# Patient Record
Sex: Female | Born: 1970 | Race: White | Hispanic: No | Marital: Single | Smoking: Never smoker
Health system: Southern US, Community
[De-identification: ages and names within clinical notes are randomized; demographics above are authoritative.]

## PROBLEM LIST (undated history)

## (undated) DIAGNOSIS — T7840XA Allergy, unspecified, initial encounter: Secondary | ICD-10-CM

## (undated) DIAGNOSIS — M6281 Muscle weakness (generalized): Secondary | ICD-10-CM

## (undated) DIAGNOSIS — R519 Headache, unspecified: Secondary | ICD-10-CM

## (undated) DIAGNOSIS — M542 Cervicalgia: Secondary | ICD-10-CM

## (undated) DIAGNOSIS — J45909 Unspecified asthma, uncomplicated: Secondary | ICD-10-CM

## (undated) DIAGNOSIS — F419 Anxiety disorder, unspecified: Secondary | ICD-10-CM

## (undated) DIAGNOSIS — Q2112 Patent foramen ovale: Secondary | ICD-10-CM

## (undated) DIAGNOSIS — M5030 Other cervical disc degeneration, unspecified cervical region: Secondary | ICD-10-CM

## (undated) DIAGNOSIS — E559 Vitamin D deficiency, unspecified: Secondary | ICD-10-CM

## (undated) DIAGNOSIS — E049 Nontoxic goiter, unspecified: Secondary | ICD-10-CM

## (undated) DIAGNOSIS — R002 Palpitations: Secondary | ICD-10-CM

## (undated) DIAGNOSIS — F329 Major depressive disorder, single episode, unspecified: Secondary | ICD-10-CM

## (undated) DIAGNOSIS — I341 Nonrheumatic mitral (valve) prolapse: Secondary | ICD-10-CM

## (undated) DIAGNOSIS — N946 Dysmenorrhea, unspecified: Secondary | ICD-10-CM

## (undated) DIAGNOSIS — Q211 Atrial septal defect: Secondary | ICD-10-CM

## (undated) DIAGNOSIS — J309 Allergic rhinitis, unspecified: Secondary | ICD-10-CM

## (undated) DIAGNOSIS — E040 Nontoxic diffuse goiter: Secondary | ICD-10-CM

## (undated) DIAGNOSIS — F32A Depression, unspecified: Secondary | ICD-10-CM

## (undated) DIAGNOSIS — R319 Hematuria, unspecified: Secondary | ICD-10-CM

## (undated) DIAGNOSIS — G8929 Other chronic pain: Secondary | ICD-10-CM

## (undated) DIAGNOSIS — M5020 Other cervical disc displacement, unspecified cervical region: Secondary | ICD-10-CM

## (undated) DIAGNOSIS — J342 Deviated nasal septum: Secondary | ICD-10-CM

## (undated) DIAGNOSIS — R51 Headache: Secondary | ICD-10-CM

## (undated) DIAGNOSIS — R8789 Other abnormal findings in specimens from female genital organs: Secondary | ICD-10-CM

## (undated) HISTORY — DX: Allergic rhinitis, unspecified: J30.9

## (undated) HISTORY — DX: Unspecified asthma, uncomplicated: J45.909

## (undated) HISTORY — DX: Deviated nasal septum: J34.2

## (undated) HISTORY — DX: Allergy, unspecified, initial encounter: T78.40XA

## (undated) HISTORY — DX: Anxiety disorder, unspecified: F41.9

## (undated) HISTORY — DX: Nonrheumatic mitral (valve) prolapse: I34.1

## (undated) HISTORY — DX: Nontoxic diffuse goiter: E04.0

## (undated) HISTORY — DX: Nontoxic goiter, unspecified: E04.9

## (undated) HISTORY — DX: Hematuria, unspecified: R31.9

## (undated) HISTORY — DX: Other chronic pain: G89.29

## (undated) HISTORY — DX: Headache, unspecified: R51.9

## (undated) HISTORY — DX: Other cervical disc displacement, unspecified cervical region: M50.20

## (undated) HISTORY — DX: Depression, unspecified: F32.A

## (undated) HISTORY — DX: Other cervical disc degeneration, unspecified cervical region: M50.30

## (undated) HISTORY — DX: Patent foramen ovale: Q21.12

## (undated) HISTORY — DX: Dysmenorrhea, unspecified: N94.6

## (undated) HISTORY — DX: Muscle weakness (generalized): M62.81

## (undated) HISTORY — DX: Palpitations: R00.2

## (undated) HISTORY — DX: Vitamin D deficiency, unspecified: E55.9

## (undated) HISTORY — DX: Cervicalgia: M54.2

## (undated) HISTORY — DX: Atrial septal defect: Q21.1

## (undated) HISTORY — DX: Major depressive disorder, single episode, unspecified: F32.9

## (undated) HISTORY — DX: Other abnormal findings in specimens from female genital organs: R87.89

## (undated) HISTORY — DX: Headache: R51

---
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
## 2015-09-22 DIAGNOSIS — R87618 Other abnormal cytological findings on specimens from cervix uteri: Secondary | ICD-10-CM

## 2015-09-22 HISTORY — DX: Other abnormal cytological findings on specimens from cervix uteri: R87.618

## 2016-01-28 LAB — HEPATIC FUNCTION PANEL
ALT: 23 U/L (ref 7–35)
AST: 24 U/L (ref 13–35)
Alkaline Phosphatase: 52 U/L (ref 25–125)
Bilirubin, Total: 0.6 mg/dL

## 2016-01-28 LAB — LIPID PANEL
CHOLESTEROL: 190 mg/dL (ref 0–200)
HDL: 59 mg/dL (ref 35–70)
LDL Cholesterol: 119 mg/dL
Triglycerides: 58 mg/dL (ref 40–160)

## 2016-01-28 LAB — BASIC METABOLIC PANEL
BUN: 12 mg/dL (ref 4–21)
Creatinine: 0.8 mg/dL (ref <=1.1)
Glucose: 98 mg/dL
POTASSIUM: 4.7 mmol/L (ref 3.4–5.3)
SODIUM: 142 mmol/L (ref 137–147)

## 2016-01-28 LAB — TSH: TSH: 1.8 u[IU]/mL (ref <=5.90)

## 2016-01-28 LAB — CBC AND DIFFERENTIAL
HCT: 41 % (ref 36–46)
HEMOGLOBIN: 13.8 g/dL (ref 12.0–16.0)
Platelets: 258 10*3/uL (ref 150–399)
WBC: 4 10*3/mL

## 2016-12-16 ENCOUNTER — Encounter: Payer: Self-pay | Admitting: Gastroenterology

## 2016-12-24 ENCOUNTER — Ambulatory Visit: Payer: Self-pay | Admitting: Family Medicine

## 2016-12-29 ENCOUNTER — Ambulatory Visit: Payer: Self-pay | Admitting: Gastroenterology

## 2017-01-01 ENCOUNTER — Ambulatory Visit: Payer: Self-pay | Admitting: Family Medicine

## 2017-01-04 ENCOUNTER — Other Ambulatory Visit: Payer: Self-pay | Admitting: Orthopedic Surgery

## 2017-01-04 DIAGNOSIS — M25331 Other instability, right wrist: Secondary | ICD-10-CM

## 2017-01-13 ENCOUNTER — Telehealth: Payer: Self-pay | Admitting: Family Medicine

## 2017-01-13 NOTE — Telephone Encounter (Signed)
Patient LMOM stating that she had some questions about our facility and she stated that she had an appointment Friday, 01/15/17.  I tried to return patient call but no answer and VM full.  Patient's appointment is on 01/18/17.

## 2017-01-14 ENCOUNTER — Encounter: Payer: Self-pay | Admitting: *Deleted

## 2017-01-15 ENCOUNTER — Other Ambulatory Visit: Payer: Self-pay

## 2017-01-18 ENCOUNTER — Encounter: Payer: Self-pay | Admitting: Family Medicine

## 2017-01-18 ENCOUNTER — Ambulatory Visit (INDEPENDENT_AMBULATORY_CARE_PROVIDER_SITE_OTHER): Payer: BLUE CROSS/BLUE SHIELD | Admitting: Family Medicine

## 2017-01-18 VITALS — BP 118/84 | HR 85 | Temp 98.5°F | Resp 20 | Ht 64.0 in | Wt 119.8 lb

## 2017-01-18 DIAGNOSIS — Q2112 Patent foramen ovale: Secondary | ICD-10-CM

## 2017-01-18 DIAGNOSIS — J342 Deviated nasal septum: Secondary | ICD-10-CM | POA: Diagnosis not present

## 2017-01-18 DIAGNOSIS — Q211 Atrial septal defect: Secondary | ICD-10-CM

## 2017-01-18 DIAGNOSIS — Z7689 Persons encountering health services in other specified circumstances: Secondary | ICD-10-CM

## 2017-01-18 DIAGNOSIS — J309 Allergic rhinitis, unspecified: Secondary | ICD-10-CM

## 2017-01-18 DIAGNOSIS — T7840XA Allergy, unspecified, initial encounter: Secondary | ICD-10-CM | POA: Diagnosis not present

## 2017-01-18 DIAGNOSIS — M5020 Other cervical disc displacement, unspecified cervical region: Secondary | ICD-10-CM | POA: Diagnosis not present

## 2017-01-18 DIAGNOSIS — M5030 Other cervical disc degeneration, unspecified cervical region: Secondary | ICD-10-CM

## 2017-01-18 DIAGNOSIS — M542 Cervicalgia: Secondary | ICD-10-CM | POA: Diagnosis not present

## 2017-01-18 NOTE — Progress Notes (Signed)
Patient ID: Erica Chung, female  DOB: Feb 11, 1971, 46 y.o.   MRN: [id number] Patient Care Team    Relationship Specialty Notifications Start End  Natalia Leatherwood, DO PCP - General Family Medicine  01/18/17   Karene Fry. Judithe Modest, MD Referring Physician Cardiology  01/18/17    Comment: PFO  Denton Lank, DO  Sports Medicine  01/18/17     Chief Complaint  Patient presents with  . Establish Care    Subjective:  Erica Chung is a 46 y.o.  female present for new patient establishment. All past medical history, surgical history, allergies, family history, immunizations, medications and social history were obtained in the electronic medical record today. All recent labs, ED visits and hospitalizations within the last year were reviewed.  Patient presents today to transfer from [facility]. She has multiple complaints today including wanting her CPE. Discussed options with patient today, and she would like to cover her acute conditions. Patient has a significant medical history of having a PFO, and is followed with cardiology, Dr. Judithe Modest. She reports chronic sinus condition, with bilateral ringing of the ears. She states this has been evaluated by ENT and they cannot find any cause for her ringing of the ears. She endorses multiple food and seasonal allergies. She endorses nasal septum deviation. She does not take allergy medications. She states she has had the testings through an allergist in the past and would like to see an allergist again concerning possible treatment.  She also has a history of a bulging cervical disc. She has known of the cervical disc for quite a few years. At one time she was established with neurology. She reports having MRIs completed. She has performed physical therapy in the past, with decent resolution of symptoms. She endorses having worsening cervical spine and left arm tingling sensation down to her hand. She states it's her entire hand that is involved. She  denies any weakness to her upper extremities. She reports it "just feels different. ". She does not like to take medications. MRI of the cervical spine 11/21/2013 unremarkable, with the exception of C5-C6 interspace demonstrates broad-based posterior disc bulge without evidence of canal stenosis or nerve root impingement. Patient was given a trial baclofen, and physical therapy.  Health maintenance:  Colonoscopy: No family history, routine screening at 38. Mammogram: No family history, has gynecology. 12/2015, birads1 per records. Cervical cancer screening: last pap: Reported 06/21/2016. Immunizations: tdap unknown (2005 per record), Influenza unknown (encouraged yearly) Infectious disease screening: HIV completed DEXA: n/a   Depression screen [facility] 2/9 01/18/2017  Decreased Interest 0  Down, Depressed, Hopeless 0  PHQ - 2 Score 0   No flowsheet data found.    Current Exercise Habits: The patient does not participate in regular exercise at present Exercise limited by: None identified Fall Risk  01/18/2017  Falls in the past year? No     Immunization History  Administered Date(s) Administered  . Varicella 06/20/2014    No exam data present  Past Medical History:  Diagnosis Date  . Allergic rhinitis   . Allergy   . Anxiety   . 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 Cervicalgia   . Chronic headaches   . Depression   . Deviated septum    perforation  . Dysmenorrhea   . Frequent headaches   . Goiter diffuse   . Hematuria   . Mitral valve prolapse   . Muscle weakness (generalized)   . Palpitation  pvc  . Patent foramen ovale   . Reactive airway disease   . Vitamin D deficiency    Allergies  Allergen Reactions  . Tree Extract     Tree, grass, shrubs   Past Surgical History:  Procedure Laterality Date  . DENTAL SURGERY  1990   wisdom teeth   Family History  Problem Relation Age of Onset  . Depression Mother   . Alcohol abuse Father     . Hearing loss Father   . Early death Father    Social History   Social History  . Marital status: Single    Spouse name: N/A  . Number of children: N/A  . Years of education: 89   Occupational History  . Property management    Social History Main Topics  . Smoking status: Never Smoker  . Smokeless tobacco: Never Used  . Alcohol use Yes     Comment: occasional  . Drug use: No  . Sexual activity: Yes    Birth control/ protection: Condom   Other Topics Concern  . Not on file   Social History Narrative   Single. Employed in Therapist, sports.    Associates degree.    Occasionally uses herbal remedies and takes daily vitamins.   Wears her seatbelt. Smoke detector in the home.   Feels safe in her relationships.      Allergies as of 01/18/2017      Reactions   Tree Extract    Tree, grass, shrubs      Medication List       Accurate as of 01/18/17  6:38 PM. Always use your most recent med list.          multivitamin capsule Take by mouth.       All past medical history, surgical history, allergies, family history, immunizations andmedications were updated in the EMR today and reviewed under the history and medication portions of their EMR.    No results found for this or any previous visit (from the past 2160 hour(s)).  Patient was never admitted.   ROS: 14 pt review of systems performed and negative (unless mentioned in an HPI)  Objective: BP 118/84 (BP Location: Left Arm, Patient Position: Sitting, Cuff Size: Normal)   Pulse 85   Temp 98.5 F (36.9 C)   Resp 20   Ht  (1.626 m)   Wt 119 lb 12.8 oz (54.3 kg)   LMP 12/30/2016 (Exact Date)   SpO2 100%   BMI 20.56 kg/m  Gen: Afebrile. No acute distress. Nontoxic in appearance, well-developed, well-nourished,  Caucasian female. HENT: AT. [place]. Bilateral TM visualized and normal in appearance, normal external auditory canal. MMM, no oral lesions, adequate dentition. Bilateral nares without erythema  or swelling, mild deviation of septum.Throat without erythema, ulcerations or exudates. No Cough on exam, no hoarseness on exam. Eyes:Pupils Equal Round Reactive to light, Extraocular movements intact,  Conjunctiva without redness, discharge or icterus. Neck/lymp/endocrine: Supple, no lymphadenopathy CV: RRR no murmur appreciated, no edema Chest: CTAB, no wheeze, rhonchi or crackles.  Skin: No rashes, purpura or petechiae. Warm and well-perfused. Skin intact. Neuro/Msk: Normal gait. PERLA. EOMi. Alert. Oriented x3.  No cervical spine bone tenderness to palpation. No erythema, no soft tissue swelling. Full range of motion cervical spine, with discomfort and extension bilateral rotation, bilateral sidebending and flexion. Normal abduction bilateral arms. Negative Tinel's left elbow and wrist. Cranial nerves II through XII intact. Muscle strength 5/5 upper/lower extremity. DTRs equal bilaterally. Psych: Normal affect, dress and  demeanor. Normal speech. Normal thought content and judgment.   Assessment/plan: Shaily Librizzi is a 46 y.o. female present for est care, with multiple complaints. Patent foramen ovale - Continue routine follow-ups with cardiology, Dr. Judithe Modest.  Deviated septum/allergic rhinitis/reports of multiple allergies/chronic sinusitis - Patient states she has been evaluated by ENT and would now like referral to a allergist. She is currently not on any allergy medications, and would like to avoid these. She is considering allergy injections. 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 Patient is against medications and injections. She is currently not on any medications or NSAIDs and functioning okay. Exam today is normal outside of discomfort, which appears to be mild, with range of motion of neck. She is currently neurovascularly intact distally in  her left arm and hand. Given options patient would like to try OMT, therefore sports med referral has been placed. She would also like to restart physical therapy, referral placed. - Ambulatory referral to Sports Medicine - Ambulatory referral to Physical Therapy - If worsening would want patient to have MRI and be seen by her neurologist. Patient is in agreement with plan.  Advised patient to make an appointment for complete physical, that is at least one year in 1 day since her last physical.    Note is dictated utilizing voice recognition software. Although note has been proof read prior to signing, occasional typographical errors still can be missed. If any questions arise, please do not hesitate to call for verification.  Electronically signed by: Felix Pacini, DO [facility]

## 2017-01-18 NOTE — Patient Instructions (Addendum)
It was nice to meet you today.   Try ALIGN probiotic daily. It is one of the better ones. Water 100 ounces a day.   I have placed an order for OMT and PT.   I also referred you to allergist.    Please help [place] help you:  We are honored you have chosen [facility] for your Primary Care home. Below you will find basic instructions that you may need to access in the future. Please help [place] help you by reading the instructions, which cover many of the frequent questions we experience.   Prescription refills and request:  -In order to allow more efficient response time, please call your pharmacy for all refills. They will forward the request electronically to [place]. This allows for the quickest possible response. Request left on a nurse line can take longer to refill, since these are checked as time allows between office patients and other phone calls.  - refill request can take up to 3-5 working days to complete.  - If request is sent electronically and request is appropiate, it is usually completed in 1-2 business days.  - all patients will need to be seen routinely for all chronic medical conditions requiring prescription medications (see follow-up below). If you are overdue for follow up on your condition, you will be asked to make an appointment and we will call in enough medication to cover you until your appointment (up to 30 days).  - all controlled substances will require a face to face visit to request/refill.  - if you desire your prescriptions to go through a new pharmacy, and have an active script at original pharmacy, you will need to call your pharmacy and have scripts transferred to new pharmacy. This is completed between the pharmacy locations and not by your provider.    Results: If any images or labs were ordered, it can take up to 1 week to get results depending on the test ordered and the lab/facility running and resulting the test. - Normal or stable results, which do not  need further discussion, may be released to your mychart immediately with attached note to you. A call may not be generated for normal results. Please make certain to sign up for mychart. If you have questions on how to activate your mychart you can call the front office.  - If your results need further discussion, our office will attempt to contact you via phone, and if unable to reach you after 2 attempts, we will release your abnormal result to your mychart with instructions.  - All results will be automatically released in mychart after 1 week.  - Your provider will provide you with explanation and instruction on all relevant material in your results. Please keep in mind, results and labs may appear confusing or abnormal to the untrained eye, but it does not mean they are actually abnormal for you personally. If you have any questions about your results that are not covered, or you desire more detailed explanation than what was provided, you should make an appointment with your provider to do so.   Our office handles many outgoing and incoming calls daily. If we have not contacted you within 1 week about your results, please check your mychart to see if there is a message first and if not, then contact our office.  In helping with this matter, you help decrease call volume, and therefore allow [place] to be able to respond to patients needs more efficiently.  Acute office visits (sick visit):  An acute visit is intended for a new problem and are scheduled in shorter time slots to allow schedule openings for patients with new problems. This is the appropriate visit to discuss a new problem. In order to provide you with excellent quality medical care with proper time for you to explain your problem, have an exam and receive treatment with instructions, these appointments should be limited to one new problem per visit. If you experience a new problem, in which you desire to be addressed, please make an acute  office visit, we save openings on the schedule to accommodate you. Please do not save your new problem for any other type of visit, let us take care of it properly and quickly for you.   Follow up visits:  Depending on your condition(s) your provider will need to see you routinely in order to provide you with quality care and prescribe medication(s). Most chronic conditions (Example: hypertension, Diabetes, depression/anxiety... etc), require visits a couple times a year. Your provider will instruct you on proper follow up for your personal medical conditions and history. Please make certain to make follow up appointments for your condition as instructed. Failing to do so could result in lapse in your medication treatment/refills. If you request a refill, and are overdue to be seen on a condition, we will always provide you with a 30 day script (once) to allow you time to schedule.    Medicare wellness (well visit): - we have a wonderful Nurse Selena Batten(Kim), that will meet with you and provide you will yearly medicare wellness visits. These visits should occur yearly (can not be scheduled less than 1 calendar year apart) and cover preventive health, immunizations, advance directives and screenings you are entitled to yearly through your medicare benefits. Do not miss out on your entitled benefits, this is when medicare will pay for these benefits to be ordered for you.  These are strongly encouraged by your provider and is the appropriate type of visit to make certain you are up to date with all preventive health benefits. If you have not had your medicare wellness exam in the last 12 months, please make certain to schedule one by calling the office and schedule your medicare wellness with Selena BattenKim as soon as possible.   Yearly physical (well visit):  - Adults are recommended to be seen yearly for physicals. Check with your insurance and date of your last physical, most insurances require one calendar year between  physicals. Physicals include all preventive health topics, screenings, medical exam and labs that are appropriate for gender/age and history. You may have fasting labs needed at this visit. This is a well visit (not a sick visit), new problems should not be covered during this visit (see acute visit).  - Pediatric patients are seen more frequently when they are younger. Your provider will advise you on well child visit timing that is appropriate for your their age. - This is not a medicare wellness visit. Medicare wellness exams do not have an exam portion to the visit. Some medicare companies allow for a physical, some do not allow a yearly physical. If your medicare allows a yearly physical you can schedule the medicare wellness with our nurse Selena BattenKim and have your physical with your provider after, on the same day. Please check with insurance for your full benefits.   Late Policy/No Shows:  - all new patients should arrive 15-30 minutes earlier than appointment to allow us time  to  obtain all personal demographics,  insurance information and for you to complete office paperwork. - All established patients should arrive 10-15 minutes earlier than appointment time to update all information and be checked in .  - In our best efforts to run on time, if you are late for your appointment you will be asked to either reschedule or if able, we will work you back into the schedule. There will be a wait time to work you back in the schedule,  depending on availability.  - If you are unable to make it to your appointment as scheduled, please call 24 hours ahead of time to allow [place] to fill the time slot with someone else who needs to be seen. If you do not cancel your appointment ahead of time, you may be charged a no show fee.

## 2017-01-25 ENCOUNTER — Encounter: Payer: Self-pay | Admitting: *Deleted

## 2017-01-26 ENCOUNTER — Ambulatory Visit: Payer: Self-pay | Admitting: Gastroenterology

## 2017-01-27 ENCOUNTER — Encounter: Payer: Self-pay | Admitting: Family Medicine

## 2017-02-04 ENCOUNTER — Ambulatory Visit: Payer: Self-pay | Admitting: Gastroenterology

## 2017-02-26 ENCOUNTER — Ambulatory Visit (INDEPENDENT_AMBULATORY_CARE_PROVIDER_SITE_OTHER): Payer: BLUE CROSS/BLUE SHIELD | Admitting: Family Medicine

## 2017-02-26 ENCOUNTER — Encounter: Payer: Self-pay | Admitting: Family Medicine

## 2017-02-26 ENCOUNTER — Encounter: Payer: BLUE CROSS/BLUE SHIELD | Admitting: Family Medicine

## 2017-02-26 VITALS — BP 114/76 | HR 91 | Temp 98.5°F | Resp 16 | Ht 64.0 in | Wt 120.0 lb

## 2017-02-26 DIAGNOSIS — E559 Vitamin D deficiency, unspecified: Secondary | ICD-10-CM | POA: Insufficient documentation

## 2017-02-26 DIAGNOSIS — Z130 Encounter for screening for diseases of the blood and blood-forming organs and certain disorders involving the immune mechanism: Secondary | ICD-10-CM

## 2017-02-26 DIAGNOSIS — E7800 Pure hypercholesterolemia, unspecified: Secondary | ICD-10-CM | POA: Diagnosis not present

## 2017-02-26 DIAGNOSIS — Z0000 Encounter for general adult medical examination without abnormal findings: Secondary | ICD-10-CM

## 2017-02-26 DIAGNOSIS — Z131 Encounter for screening for diabetes mellitus: Secondary | ICD-10-CM

## 2017-02-26 DIAGNOSIS — R946 Abnormal results of thyroid function studies: Secondary | ICD-10-CM

## 2017-02-26 DIAGNOSIS — R7989 Other specified abnormal findings of blood chemistry: Secondary | ICD-10-CM

## 2017-02-26 LAB — LIPID PANEL
Cholesterol: 181 mg/dL (ref 0–200)
HDL: 60.5 mg/dL (ref >39.00)
LDL CALC: 110 mg/dL — AB (ref 0–99)
NonHDL: 120.79
TRIGLYCERIDES: 56 mg/dL (ref 0.0–149.0)
Total CHOL/HDL Ratio: 3
VLDL: 11.2 mg/dL (ref 0.0–40.0)

## 2017-02-26 LAB — CBC WITH DIFFERENTIAL/PLATELET
BASOS PCT: 0.6 % (ref 0.0–3.0)
Basophils Absolute: 0 10*3/uL (ref 0.0–0.1)
EOS PCT: 1.7 % (ref 0.0–5.0)
Eosinophils Absolute: 0.1 10*3/uL (ref 0.0–0.7)
HCT: 40.5 % (ref 36.0–46.0)
HEMOGLOBIN: 13.5 g/dL (ref 12.0–15.0)
LYMPHS ABS: 1.4 10*3/uL (ref 0.7–4.0)
Lymphocytes Relative: 39.8 % (ref 12.0–46.0)
MCHC: 33.3 g/dL (ref 30.0–36.0)
MCV: 90 fl (ref 78.0–100.0)
MONO ABS: 0.4 10*3/uL (ref 0.1–1.0)
Monocytes Relative: 12.1 % — ABNORMAL HIGH (ref 3.0–12.0)
NEUTROS PCT: 45.8 % (ref 43.0–77.0)
Neutro Abs: 1.6 10*3/uL (ref 1.4–7.7)
Platelets: 229 10*3/uL (ref 150.0–400.0)
RBC: 4.5 Mil/uL (ref 3.87–5.11)
RDW: 13.2 % (ref 11.5–15.5)
WBC: 3.6 10*3/uL — ABNORMAL LOW (ref 4.0–10.5)

## 2017-02-26 LAB — COMPREHENSIVE METABOLIC PANEL
ALBUMIN: 4.6 g/dL (ref 3.5–5.2)
ALK PHOS: 45 U/L (ref 39–117)
ALT: 20 U/L (ref 0–35)
AST: 20 U/L (ref 0–37)
BUN: 14 mg/dL (ref 6–23)
CHLORIDE: 105 meq/L (ref 96–112)
CO2: 28 mEq/L (ref 19–32)
Calcium: 9.6 mg/dL (ref 8.4–10.5)
Creatinine, Ser: 0.68 mg/dL (ref 0.40–1.20)
GFR: 99.22 mL/min (ref >60.00)
Glucose, Bld: 90 mg/dL (ref 70–99)
POTASSIUM: 4.6 meq/L (ref 3.5–5.1)
SODIUM: 140 meq/L (ref 135–145)
Total Bilirubin: 0.6 mg/dL (ref 0.2–1.2)
Total Protein: 7.3 g/dL (ref 6.0–8.3)

## 2017-02-26 LAB — T4, FREE: Free T4: 0.94 ng/dL (ref 0.60–1.60)

## 2017-02-26 LAB — VITAMIN D 25 HYDROXY (VIT D DEFICIENCY, FRACTURES): VITD: 26.57 ng/mL — AB (ref 30.00–100.00)

## 2017-02-26 LAB — T3, FREE: T3 FREE: 3.5 pg/mL (ref 2.3–4.2)

## 2017-02-26 LAB — TSH: TSH: 1.4 u[IU]/mL (ref 0.35–4.50)

## 2017-02-26 NOTE — Patient Instructions (Signed)
Health Maintenance, Female Adopting a healthy lifestyle and getting preventive care can go a long way to promote health and wellness. Talk with your health care provider about what schedule of regular examinations is right for you. This is a good chance for you to check in with your provider about disease prevention and staying healthy. In between checkups, there are plenty of things you can do on your own. Experts have done a lot of research about which lifestyle changes and preventive measures are most likely to keep you healthy. Ask your health care provider for more information. Weight and diet Eat a healthy diet  Be sure to include plenty of vegetables, fruits, low-fat dairy products, and lean protein.  Do not eat a lot of foods high in solid fats, added sugars, or salt.  Get regular exercise. This is one of the most important things you can do for your health. ? Most adults should exercise for at least 150 minutes each week. The exercise should increase your heart rate and make you sweat (moderate-intensity exercise). ? Most adults should also do strengthening exercises at least twice a week. This is in addition to the moderate-intensity exercise.  Maintain a healthy weight  Body mass index (BMI) is a measurement that can be used to identify possible weight problems. It estimates body fat based on height and weight. Your health care provider can help determine your BMI and help you achieve or maintain a healthy weight.  For females 20 years of age and older: ? A BMI below 18.5 is considered underweight. ? A BMI of 18.5 to 24.9 is normal. ? A BMI of 25 to 29.9 is considered overweight. ? A BMI of 30 and above is considered obese.  Watch levels of cholesterol and blood lipids  You should start having your blood tested for lipids and cholesterol at 46 years of age, then have this test every 5 years.  You may need to have your cholesterol levels checked more often if: ? Your lipid or  cholesterol levels are high. ? You are older than 46 years of age. ? You are at high risk for heart disease.  Cancer screening Lung Cancer  Lung cancer screening is recommended for adults 55-80 years old who are at high risk for lung cancer because of a history of smoking.  A yearly low-dose CT scan of the lungs is recommended for people who: ? Currently smoke. ? Have quit within the past 15 years. ? Have at least a 30-pack-year history of smoking. A pack year is smoking an average of one pack of cigarettes a day for 1 year.  Yearly screening should continue until it has been 15 years since you quit.  Yearly screening should stop if you develop a health problem that would prevent you from having lung cancer treatment.  Breast Cancer  Practice breast self-awareness. This means understanding how your breasts normally appear and feel.  It also means doing regular breast self-exams. Let your health care provider know about any changes, no matter how small.  If you are in your 20s or 30s, you should have a clinical breast exam (CBE) by a health care provider every 1-3 years as part of a regular health exam.  If you are 40 or older, have a CBE every year. Also consider having a breast X-ray (mammogram) every year.  If you have a family history of breast cancer, talk to your health care provider about genetic screening.  If you are at high risk   for breast cancer, talk to your health care provider about having an MRI and a mammogram every year.  Breast cancer gene (BRCA) assessment is recommended for women who have family members with BRCA-related cancers. BRCA-related cancers include: ? Breast. ? Ovarian. ? Tubal. ? Peritoneal cancers.  Results of the assessment will determine the need for genetic counseling and BRCA1 and BRCA2 testing.  Cervical Cancer Your health care provider may recommend that you be screened regularly for cancer of the pelvic organs (ovaries, uterus, and  vagina). This screening involves a pelvic examination, including checking for microscopic changes to the surface of your cervix (Pap test). You may be encouraged to have this screening done every 3 years, beginning at age 22.  For women ages 56-65, health care providers may recommend pelvic exams and Pap testing every 3 years, or they may recommend the Pap and pelvic exam, combined with testing for human papilloma virus (HPV), every 5 years. Some types of HPV increase your risk of cervical cancer. Testing for HPV may also be done on women of any age with unclear Pap test results.  Other health care providers may not recommend any screening for nonpregnant women who are considered low risk for pelvic cancer and who do not have symptoms. Ask your health care provider if a screening pelvic exam is right for you.  If you have had past treatment for cervical cancer or a condition that could lead to cancer, you need Pap tests and screening for cancer for at least 20 years after your treatment. If Pap tests have been discontinued, your risk factors (such as having a new sexual partner) need to be reassessed to determine if screening should resume. Some women have medical problems that increase the chance of getting cervical cancer. In these cases, your health care provider may recommend more frequent screening and Pap tests.  Colorectal Cancer  This type of cancer can be detected and often prevented.  Routine colorectal cancer screening usually begins at 46 years of age and continues through 46 years of age.  Your health care provider may recommend screening at an earlier age if you have risk factors for colon cancer.  Your health care provider may also recommend using home test kits to check for hidden blood in the stool.  A small camera at the end of a tube can be used to examine your colon directly (sigmoidoscopy or colonoscopy). This is done to check for the earliest forms of colorectal  cancer.  Routine screening usually begins at age 33.  Direct examination of the colon should be repeated every 5-10 years through 46 years of age. However, you may need to be screened more often if early forms of precancerous polyps or small growths are found.  Skin Cancer  Check your skin from head to toe regularly.  Tell your health care provider about any new moles or changes in moles, especially if there is a change in a mole's shape or color.  Also tell your health care provider if you have a mole that is larger than the size of a pencil eraser.  Always use sunscreen. Apply sunscreen liberally and repeatedly throughout the day.  Protect yourself by wearing long sleeves, pants, a wide-brimmed hat, and sunglasses whenever you are outside.  Heart disease, diabetes, and high blood pressure  High blood pressure causes heart disease and increases the risk of stroke. High blood pressure is more likely to develop in: ? People who have blood pressure in the high end of  the normal range (130-139/85-89 mm Hg). ? People who are overweight or obese. ? People who are African American.  If you are 21-29 years of age, have your blood pressure checked every 3-5 years. If you are 3 years of age or older, have your blood pressure checked every year. You should have your blood pressure measured twice-once when you are at a hospital or clinic, and once when you are not at a hospital or clinic. Record the average of the two measurements. To check your blood pressure when you are not at a hospital or clinic, you can use: ? An automated blood pressure machine at a pharmacy. ? A home blood pressure monitor.  If you are between 17 years and 37 years old, ask your health care provider if you should take aspirin to prevent strokes.  Have regular diabetes screenings. This involves taking a blood sample to check your fasting blood sugar level. ? If you are at a normal weight and have a low risk for diabetes,  have this test once every three years after 46 years of age. ? If you are overweight and have a high risk for diabetes, consider being tested at a younger age or more often. Preventing infection Hepatitis B  If you have a higher risk for hepatitis B, you should be screened for this virus. You are considered at high risk for hepatitis B if: ? You were born in a country where hepatitis B is common. Ask your health care provider which countries are considered high risk. ? Your parents were born in a high-risk country, and you have not been immunized against hepatitis B (hepatitis B vaccine). ? You have HIV or AIDS. ? You use needles to inject street drugs. ? You live with someone who has hepatitis B. ? You have had sex with someone who has hepatitis B. ? You get hemodialysis treatment. ? You take certain medicines for conditions, including cancer, organ transplantation, and autoimmune conditions.  Hepatitis C  Blood testing is recommended for: ? Everyone born from [birth year] through 1965. ? Anyone with known risk factors for hepatitis C.  Sexually transmitted infections (STIs)  You should be screened for sexually transmitted infections (STIs) including gonorrhea and chlamydia if: ? You are sexually active and are younger than 46 years of age. ? You are older than 46 years of age and your health care provider tells you that you are at risk for this type of infection. ? Your sexual activity has changed since you were last screened and you are at an increased risk for chlamydia or gonorrhea. Ask your health care provider if you are at risk.  If you do not have HIV, but are at risk, it may be recommended that you take a prescription medicine daily to prevent HIV infection. This is called pre-exposure prophylaxis (PrEP). You are considered at risk if: ? You are sexually active and do not regularly use condoms or know the HIV status of your partner(s). ? You take drugs by injection. ? You are  sexually active with a partner who has HIV.  Talk with your health care provider about whether you are at high risk of being infected with HIV. If you choose to begin PrEP, you should first be tested for HIV. You should then be tested every 3 months for as long as you are taking PrEP. Pregnancy  If you are premenopausal and you may become pregnant, ask your health care provider about preconception counseling.  If you may become  pregnant, take 400 to 800 micrograms (mcg) of folic acid every day.  If you want to prevent pregnancy, talk to your health care provider about birth control (contraception). Osteoporosis and menopause  Osteoporosis is a disease in which the bones lose minerals and strength with aging. This can result in serious bone fractures. Your risk for osteoporosis can be identified using a bone density scan.  If you are 27 years of age or older, or if you are at risk for osteoporosis and fractures, ask your health care provider if you should be screened.  Ask your health care provider whether you should take a calcium or vitamin D supplement to lower your risk for osteoporosis.  Menopause may have certain physical symptoms and risks.  Hormone replacement therapy may reduce some of these symptoms and risks. Talk to your health care provider about whether hormone replacement therapy is right for you. Follow these instructions at home:  Schedule regular health, dental, and eye exams.  Stay current with your immunizations.  Do not use any tobacco products including cigarettes, chewing tobacco, or electronic cigarettes.  If you are pregnant, do not drink alcohol.  If you are breastfeeding, limit how much and how often you drink alcohol.  Limit alcohol intake to no more than 1 drink per day for nonpregnant women. One drink equals 12 ounces of beer, 5 ounces of wine, or 1 ounces of hard liquor.  Do not use street drugs.  Do not share needles.  Ask your health care  provider for help if you need support or information about quitting drugs.  Tell your health care provider if you often feel depressed.  Tell your health care provider if you have ever been abused or do not feel safe at home. This information is not intended to replace advice given to you by your health care provider. Make sure you discuss any questions you have with your health care provider. Document Released: 03/23/2011 Document Revised: 02/13/2016 Document Reviewed: 06/11/2015 Elsevier Interactive Patient Education  2018 [employer].   Please help [place] help you:  We are honored you have chosen [facility] for your Primary Care home. Below you will find basic instructions that you may need to access in the future. Please help [place] help you by reading the instructions, which cover many of the frequent questions we experience.   Prescription refills and request:  -In order to allow more efficient response time, please call your pharmacy for all refills. They will forward the request electronically to [place]. This allows for the quickest possible response. Request left on a nurse line can take longer to refill, since these are checked as time allows between office patients and other phone calls.  - refill request can take up to 3-5 working days to complete.  - If request is sent electronically and request is appropiate, it is usually completed in 1-2 business days.  - all patients will need to be seen routinely for all chronic medical conditions requiring prescription medications (see follow-up below). If you are overdue for follow up on your condition, you will be asked to make an appointment and we will call in enough medication to cover you until your appointment (up to 30 days).  - all controlled substances will require a face to face visit to request/refill.  - if you desire your prescriptions to go through a new pharmacy, and have an active script at original pharmacy, you will need to call  your pharmacy and have scripts transferred to  new pharmacy. This is completed between the pharmacy locations and not by your provider.    Results: If any images or labs were ordered, it can take up to 1 week to get results depending on the test ordered and the lab/facility running and resulting the test. - Normal or stable results, which do not need further discussion, may be released to your mychart immediately with attached note to you. A call may not be generated for normal results. Please make certain to sign up for mychart. If you have questions on how to activate your mychart you can call the front office.  - If your results need further discussion, our office will attempt to contact you via phone, and if unable to reach you after 2 attempts, we will release your abnormal result to your mychart with instructions.  - All results will be automatically released in mychart after 1 week.  - Your provider will provide you with explanation and instruction on all relevant material in your results. Please keep in mind, results and labs may appear confusing or abnormal to the untrained eye, but it does not mean they are actually abnormal for you personally. If you have any questions about your results that are not covered, or you desire more detailed explanation than what was provided, you should make an appointment with your provider to do so.   Our office handles many outgoing and incoming calls daily. If we have not contacted you within 1 week about your results, please check your mychart to see if there is a message first and if not, then contact our office.  In helping with this matter, you help decrease call volume, and therefore allow [place] to be able to respond to patients needs more efficiently.   Acute office visits (sick visit):  An acute visit is intended for a new problem and are scheduled in shorter time slots to allow schedule openings for patients with new problems. This is the appropriate visit  to discuss a new problem. In order to provide you with excellent quality medical care with proper time for you to explain your problem, have an exam and receive treatment with instructions, these appointments should be limited to one new problem per visit. If you experience a new problem, in which you desire to be addressed, please make an acute office visit, we save openings on the schedule to accommodate you. Please do not save your new problem for any other type of visit, let us take care of it properly and quickly for you.   Follow up visits:  Depending on your condition(s) your provider will need to see you routinely in order to provide you with quality care and prescribe medication(s). Most chronic conditions (Example: hypertension, Diabetes, depression/anxiety... etc), require visits a couple times a year. Your provider will instruct you on proper follow up for your personal medical conditions and history. Please make certain to make follow up appointments for your condition as instructed. Failing to do so could result in lapse in your medication treatment/refills. If you request a refill, and are overdue to be seen on a condition, we will always provide you with a 30 day script (once) to allow you time to schedule.    Medicare wellness (well visit): - we have a wonderful Nurse Maudie Mercury), that will meet with you and provide you will yearly medicare wellness visits. These visits should occur yearly (can not be scheduled less than 1 calendar year apart) and cover preventive health, immunizations, advance directives and  screenings you are entitled to yearly through your medicare benefits. Do not miss out on your entitled benefits, this is when medicare will pay for these benefits to be ordered for you.  These are strongly encouraged by your provider and is the appropriate type of visit to make certain you are up to date with all preventive health benefits. If you have not had your medicare wellness exam in  the last 12 months, please make certain to schedule one by calling the office and schedule your medicare wellness with Maudie Mercury as soon as possible.   Yearly physical (well visit):  - Adults are recommended to be seen yearly for physicals. Check with your insurance and date of your last physical, most insurances require one calendar year between physicals. Physicals include all preventive health topics, screenings, medical exam and labs that are appropriate for gender/age and history. You may have fasting labs needed at this visit. This is a well visit (not a sick visit), new problems should not be covered during this visit (see acute visit).  - Pediatric patients are seen more frequently when they are younger. Your provider will advise you on well child visit timing that is appropriate for your their age. - This is not a medicare wellness visit. Medicare wellness exams do not have an exam portion to the visit. Some medicare companies allow for a physical, some do not allow a yearly physical. If your medicare allows a yearly physical you can schedule the medicare wellness with our nurse Maudie Mercury and have your physical with your provider after, on the same day. Please check with insurance for your full benefits.   Late Policy/No Shows:  - all new patients should arrive 15-30 minutes earlier than appointment to allow [place] time  to  obtain all personal demographics,  insurance information and for you to complete office paperwork. - All established patients should arrive 10-15 minutes earlier than appointment time to update all information and be checked in .  - In our best efforts to run on time, if you are late for your appointment you will be asked to either reschedule or if able, we will work you back into the schedule. There will be a wait time to work you back in the schedule,  depending on availability.  - If you are unable to make it to your appointment as scheduled, please call 24 hours ahead of time to allow [place]  to fill the time slot with someone else who needs to be seen. If you do not cancel your appointment ahead of time, you may be charged a no show fee.

## 2017-02-26 NOTE — Progress Notes (Signed)
Patient ID: Erica Chung, female  DOB: 1971-08-26, 46 y.o.   MRN: [id number] Patient Care Team    Relationship Specialty Notifications Start End  Ma Hillock, DO PCP - General Family Medicine  01/18/17   Flossie Buffy., MD Referring Physician Cardiology  01/18/17    Comment: PFO  Begovich, Geradine Girt, DO  Sports Medicine  01/18/17   Oliver Pila, MD Referring Physician Obstetrics and Gynecology  02/26/17     Chief Complaint  Patient presents with  . Annual Exam    CPE    Subjective:  Erica Chung is a 46 y.o.  Female  present for CPE. All past medical history, surgical history, allergies, family history, immunizations, medications and social history were updated in the electronic medical record today. All recent labs, ED visits and hospitalizations within the last year were reviewed.  Health maintenance:  Colonoscopy: No family history, routine screening at 47. Mammogram: No family history, has gynecology. 12/2015, birads1 per records. Dr.  Cervical cancer screening: last pap: Reported 06/21/2016. Immunizations: tdap unknown (2005 per record) she will double check she feels she has had one since, Influenza unknown (encouraged yearly) Infectious disease screening: HIV completed DEXA: n/a Assistive device: None Oxygen KKX:FGHW Patient has a Dental home. Hospitalizations/ED visits: none  Depression screen [facility] 2/9 02/26/2017 01/18/2017  Decreased Interest 0 0  Down, Depressed, Hopeless 0 0  PHQ - 2 Score 0 0   No flowsheet data found.   Current Exercise Habits: The patient does not participate in regular exercise at present    Immunization History  Administered Date(s) Administered  . Varicella 06/20/2014    Past Medical History:  Diagnosis Date  . Allergic rhinitis   . Allergy   . Anxiety   . 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 Cervicalgia   . Chronic headaches   . Depression   . Deviated septum    perforation  .  Dysmenorrhea   . Frequent headaches   . Goiter diffuse   . Hematuria   . Mitral valve prolapse   . Muscle weakness (generalized)   . Palpitation    pvc  . Pap smear abnormality of cervix/human papillomavirus (HPV) positive 2017  . Patent foramen ovale   . Reactive airway disease   . Vitamin D deficiency    Allergies  Allergen Reactions  . Tree Extract     Tree, grass, shrubs   Past Surgical History:  Procedure Laterality Date  . DENTAL SURGERY  1990   wisdom teeth   Family History  Problem Relation Age of Onset  . Depression Mother   . Alcohol abuse Father   . Hearing loss Father   . Early death Father    Social History   Social History  . Marital status: Single    Spouse name: N/A  . Number of children: N/A  . Years of education: 55   Occupational History  . Property management    Social History Main Topics  . Smoking status: Never Smoker  . Smokeless tobacco: Never Used  . Alcohol use Yes     Comment: occasional  . Drug use: No  . Sexual activity: Yes    Birth control/ protection: Condom   Other Topics Concern  . 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    Associates degree.    Occasionally uses herbal remedies and takes daily vitamins.   Wears her seatbelt. Smoke detector in  the home.   Feels safe in her relationships.      Allergies as of 02/26/2017      Reactions   Tree Extract    Tree, grass, shrubs      Medication List       Accurate as of 02/26/17 12:41 PM. Always use your most recent med list.          multivitamin capsule Take by mouth.       All past medical history, surgical history, allergies, family history, immunizations andmedications were updated in the EMR today and reviewed under the history and medication portions of their EMR.     No results found for this or any previous visit (from the past 2160 hour(s)).  Patient was never admitted.  ROS: 14 pt review of systems  performed and negative (unless mentioned in an HPI)  Objective: BP 114/76 (BP Location: Left Arm, Patient Position: Sitting, Cuff Size: Normal)   Pulse 91   Temp 98.5 F (36.9 C) (Oral)   Resp 16   Ht '5\' 4"'$  (1.626 m)   Wt 120 lb (54.4 kg)   SpO2 100%   BMI 20.60 kg/m  Gen: Afebrile. No acute distress. Nontoxic in appearance, well-developed, well-nourished,  Thin caucasian female.  HENT: AT. [address]. Bilateral TM visualized and normal in appearance, normal external auditory canal. MMM, no oral lesions, adequate dentition. Bilateral nares within normal limits. Throat without erythema, ulcerations or exudates. no Cough on exam, no hoarseness on exam. Eyes:Pupils Equal Round Reactive to light, Extraocular movements intact,  Conjunctiva without redness, discharge or icterus. Neck/lymp/endocrine: Supple,no lymphadenopathy, no thyromegaly CV: RRR no murmur, no edema, +2/4 P posterior tibialis pulses. no carotid bruits. No JVD. Chest: CTAB, no wheeze, rhonchi or crackles. normal Respiratory effort. good Air movement. Abd: Soft. flat. NTND. BS present. no Masses palpated. No hepatosplenomegaly. No rebound tenderness or guarding. Skin: no rashes, purpura or petechiae. Warm and well-perfused. Skin intact. Neuro/Msk:  Normal gait. PERLA. EOMi. Alert. Oriented x3.  Cranial nerves II through XII intact. Muscle strength 5/5 upper/lower extremity. DTRs equal bilaterally. Psych: Normal affect, dress and demeanor. Normal speech. Normal thought content and judgment.  No exam data present  Assessment/plan: Erica Chung is a 46 y.o. female present for CPE. Vitamin D deficiency - Vitamin D (25 hydroxy) Screening for deficiency anemia - CBC w/Diff Elevated cholesterol - Lipid panel Abnormal thyroid stimulating hormone level - Comp Met (CMET) - TSH - T3, free - T4, free Diabetes mellitus screening - Hemoglobin A1c Encounter for preventive health examination Patient was encouraged to exercise greater  than 150 minutes a week. Patient was encouraged to choose a diet filled with fresh fruits and vegetables, and lean meats. AVS provided to patient today for education/recommendation on gender specific health and safety maintenance. Colonoscopy: No family history, routine screening at 55. Mammogram: No family history, has gynecology. 12/2015, birads1 per records. Dr.  Cervical cancer screening: last pap: Reported 06/21/2016. Immunizations: tdap unknown (2005 per record) she will double check she feels she has had one since, Influenza unknown (encouraged yearly) Infectious disease screening: HIV completed DEXA: n/a   Return in about 1 year (around 02/26/2018) for CPE.  Electronically signed by: Howard Pouch, DO [place]

## 2017-02-27 LAB — HEMOGLOBIN A1C
HEMOGLOBIN A1C: 5 % (ref <5.7)
MEAN PLASMA GLUCOSE: 97 mg/dL

## 2017-03-01 ENCOUNTER — Telehealth: Payer: Self-pay | Admitting: Family Medicine

## 2017-03-01 NOTE — Telephone Encounter (Signed)
Please call pt: - her labs are normal, with the exception of mildly insufficient Vit d at 26, normal is 30. I would sh=uggest she increase her vit d supplement, make sure she is taking daily with food.

## 2017-03-01 NOTE — Telephone Encounter (Signed)
Left message on voicemail for patient to return call. 

## 2017-03-02 NOTE — Telephone Encounter (Signed)
Patient notified and verbalized understanding. 

## 2017-03-19 ENCOUNTER — Ambulatory Visit: Payer: BLUE CROSS/BLUE SHIELD | Admitting: Family Medicine

## 2017-03-25 ENCOUNTER — Ambulatory Visit: Payer: BLUE CROSS/BLUE SHIELD | Admitting: Family Medicine

## 2017-04-16 ENCOUNTER — Encounter: Payer: Self-pay | Admitting: Family Medicine

## 2017-04-16 ENCOUNTER — Ambulatory Visit (INDEPENDENT_AMBULATORY_CARE_PROVIDER_SITE_OTHER)
Admission: RE | Admit: 2017-04-16 | Discharge: 2017-04-16 | Disposition: A | Discharge status: Home / Self Care | Payer: BLUE CROSS/BLUE SHIELD | Source: Ambulatory Visit | Attending: Family Medicine | Admitting: Family Medicine

## 2017-04-16 ENCOUNTER — Ambulatory Visit (INDEPENDENT_AMBULATORY_CARE_PROVIDER_SITE_OTHER): Payer: BLUE CROSS/BLUE SHIELD | Admitting: Family Medicine

## 2017-04-16 ENCOUNTER — Ambulatory Visit: Payer: BLUE CROSS/BLUE SHIELD | Admitting: Family Medicine

## 2017-04-16 VITALS — BP 120/80 | HR 76 | Ht 64.0 in | Wt 121.0 lb

## 2017-04-16 DIAGNOSIS — M5412 Radiculopathy, cervical region: Secondary | ICD-10-CM

## 2017-04-16 DIAGNOSIS — M542 Cervicalgia: Secondary | ICD-10-CM

## 2017-04-16 DIAGNOSIS — M999 Biomechanical lesion, unspecified: Secondary | ICD-10-CM | POA: Insufficient documentation

## 2017-04-16 NOTE — Progress Notes (Signed)
Tawana ScaleZach Smith D.O. [facility] [address] [place], [place] [address] Phone: [phone] Subjective:    I'm seeing this patient by the request  of:    CC:   FTD:[id number]:Subjective  Erica BienenstockRoseanne Maren ReamerO'Grady is a 46 y.o. female coming in with complaint of neck pain patient is had this significant nighttime. Has seen multiple different providers over the course last 3 years. Has not made any significant improvement. Patient states sometimes it can be so severe that she doesn't want to get out of bed. Rates the severity pain is 8 out of 10. Patient states sometimes it seems to radiate down the arm somewhat. No associated numbness though. Not seem to respond to over-the-counter medications anymore.    patient does have an MRI of the cervical spine back in 2016. This was independently visualized by me showing a very mild broad-based posterior disc bulge at C5-C6 with no nerve impingement. Otherwise fairly unremarkable.   Past Medical History:  Diagnosis Date  . Allergic rhinitis   . Allergy   . Anxiety   . 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 Cervicalgia   . Chronic headaches   . Depression   . Deviated septum    perforation  . Dysmenorrhea   . Frequent headaches   . Goiter diffuse   . Hematuria   . Mitral valve prolapse   . Muscle weakness (generalized)   . Palpitation    pvc  . Pap smear abnormality of cervix/human papillomavirus (HPV) positive 2017  . Patent foramen ovale   . Reactive airway disease   . Vitamin D deficiency    Past Surgical History:  Procedure Laterality Date  . DENTAL SURGERY  1990   wisdom teeth   Social History   Social History  . Marital status: Single    Spouse name: N/A  . Number of children: N/A  . Years of education: 3514   Occupational History  . Property management    Social History Main Topics  . Smoking status: Never Smoker  . Smokeless tobacco: Never Used  . Alcohol use Yes     Comment: occasional  . Drug use:  No  . Sexual activity: Yes    Birth control/ protection: Condom   Other Topics Concern  . None   Social History Narrative   Single. Employed in Therapist, sportsproperty management.    Associates degree.    Occasionally uses herbal remedies and takes daily vitamins.   Wears her seatbelt. Smoke detector in the home.   Feels safe in her relationships.      Allergies  Allergen Reactions  . Tree Extract     Tree, grass, shrubs   Family History  Problem Relation Age of Onset  . Depression Mother   . Alcohol abuse Father   . Hearing loss Father   . Early death Father      Past medical history, social, surgical and family history all reviewed in electronic medical record.  No pertanent information unless stated regarding to the chief complaint.   Review of Systems:Review of systems updated and as accurate as of 04/16/17  No headache, visual changes, nausea, vomiting, diarrhea, constipation, dizziness, abdominal pain, skin rash, fevers, chills, night sweats, weight loss, swollen lymph nodes, body aches, joint swelling, muscle aches, chest pain, shortness of breath, mood changes.   Objective  Blood pressure 120/80, pulse 76, height 5\' 4"  (1.626 m), weight 121 lb (54.9 kg). Systems examined below as of 04/16/17   General: No apparent  distress alert and oriented x3 mood and affect normal, dressed appropriately.  HEENT: Pupils equal, extraocular movements intact  Respiratory: Patient's speak in full sentences and does not appear short of breath  Cardiovascular: No lower extremity edema, non tender, no erythema  Skin: Warm dry intact with no signs of infection or rash on extremities or on axial skeleton.  Abdomen: Soft nontender  Neuro: Cranial nerves II through XII are intact, neurovascularly intact in all extremities with 2+ DTRs and 2+ pulses.  Lymph: No lymphadenopathy of posterior or anterior cervical chain or axillae bilaterally.  Gait normal with good balance and coordination.  MSK:  Non  tender with full range of motion and good stability and symmetric strength and tone of shoulders, elbows, wrist, hip, knee and ankles bilaterally.   Neck: Inspection increase in lordosis. No palpable stepoffs. Negative Spurling's maneuver. Still mild limitation. In flexion and side bending.  Grip strength and sensation normal in bilateral hands Strength good C4 to T1 distribution No sensory change to C4 to T1 Negative Hoffman sign bilaterally Reflexes normal  Osteopathic findings C2 flexed rotated and side bent right C4 flexed rotated and side bent left C7 flexed rotated and side bent left T9 extended rotated and side bent left L3 flexed rotated and side bent right Sacrum right on right    Impression and Recommendations:     This case required medical decision making of moderate complexity.      Note: This dictation was prepared with Dragon dictation along with smaller phrase technology. Any transcriptional errors that result from this process are unintentional.

## 2017-04-16 NOTE — Patient Instructions (Signed)
Good to see you  Gustavus Bryantce is your friend.  Ice 20 minutes 2 times daily. Usually after activity and before bed. We will get you a standing desk.  Tennis ball between shoulder blades Vitramin D 2000 IU daily  Turmeric 500mg  twice daily  Tart cherry extract any dose at night Massage is good.  pennsaid pinkie amount topically 2 times daily as needed.   Protein supplement an extra 20 minutes daily  Lets do get a new xray  See me again in 3-4 weeks.

## 2017-04-16 NOTE — Assessment & Plan Note (Signed)
Patient does have neck pain. I do think that this is multifactorial. Very minimal arthritic changes on the MRI previously seen. We discussed icing regimen, home exercises, posture. Patient work with Event organiserathletic trainer to learn home exercises in greater detail. We discussed icing regimen. Discussed which activities to do in which ones to avoid. Follow-up again in 4 weeks. Responded well to a supine manipulation.

## 2017-04-16 NOTE — Progress Notes (Signed)
Erica ScaleZach Chung D.O. [facility] [address] [place], [place] [address] Phone: [phone] Subjective:    I'm seeing this patient by the request  of:    CC:   [name]:Subjective  Erica BienenstockRoseanne Maren ReamerO'Grady is a 46 y.o. female coming in with complaint of neck pain. She said that she has had a budging disc in her neck since 2007. She is having pain on a consistent basis and is looking for some relief instead of having to have surgery. She complains of having headaches all the time due to the pain and tightness. She has tried Financial controllerchanging ergonomics at her work which seemed to help but she got another job where she does not have good posture.   Onset- 2007 Location- cervical spine  Duration- constant Character- dull  Aggravating factors- shoulder movements Reliving factors- heat, ergonomics Therapies tried- heat, has not tried NSAIDS Severity- 5/10      Past Medical History:  Diagnosis Date  . Allergic rhinitis   . Allergy   . Anxiety   . 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 Cervicalgia   . Chronic headaches   . Depression   . Deviated septum    perforation  . Dysmenorrhea   . Frequent headaches   . Goiter diffuse   . Hematuria   . Mitral valve prolapse   . Muscle weakness (generalized)   . Palpitation    pvc  . Pap smear abnormality of cervix/human papillomavirus (HPV) positive 2017  . Patent foramen ovale   . Reactive airway disease   . Vitamin D deficiency    Past Surgical History:  Procedure Laterality Date  . DENTAL SURGERY  1990   wisdom teeth   Social History   Social History  . Marital status: Single    Spouse name: N/A  . Number of children: N/A  . Years of education: 6914   Occupational History  . Property management    Social History Main Topics  . Smoking status: Never Smoker  . Smokeless tobacco: Never Used  . Alcohol use Yes     Comment: occasional  . Drug use: No  . Sexual activity: Yes    Birth control/ protection:  Condom   Other Topics Concern  . Not on file   Social History Narrative   Single. Employed in Therapist, sportsproperty management.    Associates degree.    Occasionally uses herbal remedies and takes daily vitamins.   Wears her seatbelt. Smoke detector in the home.   Feels safe in her relationships.      Allergies  Allergen Reactions  . Tree Extract     Tree, grass, shrubs   Family History  Problem Relation Age of Onset  . Depression Mother   . Alcohol abuse Father   . Hearing loss Father   . Early death Father      Past medical history, social, surgical and family history all reviewed in electronic medical record.  No pertanent information unless stated regarding to the chief complaint.   Review of Systems:Review of systems updated and as accurate as of 04/16/17  No headache, visual changes, nausea, vomiting, diarrhea, constipation, dizziness, abdominal pain, skin rash, fevers, chills, night sweats, weight loss, swollen lymph nodes, body aches, joint swelling, muscle aches, chest pain, shortness of breath, mood changes.   Objective  There were no vitals taken for this visit. Systems examined below as of 04/16/17   General: No apparent distress alert and oriented x3 mood and affect normal, dressed  appropriately.  HEENT: Pupils equal, extraocular movements intact  Respiratory: Patient's speak in full sentences and does not appear short of breath  Cardiovascular: No lower extremity edema, non tender, no erythema  Skin: Warm dry intact with no signs of infection or rash on extremities or on axial skeleton.  Abdomen: Soft nontender  Neuro: Cranial nerves II through XII are intact, neurovascularly intact in all extremities with 2+ DTRs and 2+ pulses.  Lymph: No lymphadenopathy of posterior or anterior cervical chain or axillae bilaterally.  Gait normal with good balance and coordination.  MSK:  Non tender with full range of motion and good stability and symmetric strength and tone of  shoulders, elbows, wrist, hip, knee and ankles bilaterally.     Impression and Recommendations:     This case required medical decision making of moderate complexity.      Note: This dictation was prepared with Dragon dictation along with smaller phrase technology. Any transcriptional errors that result from this process are unintentional.

## 2017-04-16 NOTE — Assessment & Plan Note (Signed)
Decision today to treat with OMT was based on Physical Exam  After verbal consent patient was treated with HVLA, ME, FPR techniques in cervical, thoracic, lumbar and sacral areas  Patient tolerated the procedure well with improvement in symptoms  Patient given exercises, stretches and lifestyle modifications  See medications in patient instructions if given  Patient will follow up in 4 weeks 

## 2017-04-19 ENCOUNTER — Encounter: Payer: Self-pay | Admitting: Family Medicine

## 2017-04-26 ENCOUNTER — Encounter: Payer: Self-pay | Admitting: Family Medicine

## 2017-05-14 ENCOUNTER — Ambulatory Visit: Payer: BLUE CROSS/BLUE SHIELD | Admitting: Family Medicine

## 2017-05-24 NOTE — Progress Notes (Signed)
[name] [address] [place], [place] [address] Phone: [phone] Subjective:     CC: Neck pain  [id number]  Erica Chung is a 46 y.o. female coming in with complaint of neck pain. Patient was found to have more of a muscular component that seemed to be getting more of a cervicogenic headaches. Seem to be worsening at night. Given home exercises, icing regimen, which activities doing which ones to avoid. Patient did start with osteopathic manipulation. Patient states Did have a couple days of improvement. Patient did get a standing desk at work on Friday but has not using it. Patient has been doing the exercises intermittently. Patient states that there has been no significant changes otherwise. Still having the discomfort from time to time. Looking for other things a can help. Concerned about continuing to come on a regular basis secondary to the discomfort and pain    patient does have an MRI of the cervical spine back in 2016. This was independently visualized by me showing a very mild broad-based posterior disc bulge at C5-C6 with no nerve impingement. Otherwise fairly unremarkable.   Past Medical History:  Diagnosis Date  . Allergic rhinitis   . Allergy   . Anxiety   . 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 Cervicalgia   . Chronic headaches   . Depression   . Deviated septum    perforation  . Dysmenorrhea   . Frequent headaches   . Goiter diffuse   . Hematuria   . Mitral valve prolapse   . Muscle weakness (generalized)   . Palpitation    pvc  . Pap smear abnormality of cervix/human papillomavirus (HPV) positive 2017  . Patent foramen ovale   . Reactive airway disease   . Vitamin D deficiency    Past Surgical History:  Procedure Laterality Date  . DENTAL SURGERY  1990   wisdom teeth   Social History   Social History  . Marital status: Single    Spouse name: N/A  . Number of children: N/A  . Years  of education: 51   Occupational History  . Property management    Social History Main Topics  . Smoking status: Never Smoker  . Smokeless tobacco: Never Used  . Alcohol use Yes     Comment: occasional  . Drug use: No  . Sexual activity: Yes    Birth control/ protection: Condom   Other Topics Concern  . None   Social History Narrative   Single. Employed in Therapist, sports.    Associates degree.    Occasionally uses herbal remedies and takes daily vitamins.   Wears her seatbelt. Smoke detector in the home.   Feels safe in her relationships.      Allergies  Allergen Reactions  . Tree Extract     Tree, grass, shrubs   Family History  Problem Relation Age of Onset  . Depression Mother   . Alcohol abuse Father   . Hearing loss Father   . Early death Father      Past medical history, social, surgical and family history all reviewed in electronic medical record.  No pertanent information unless stated regarding to the chief complaint.   Review of Systems: No headache, visual changes, nausea, vomiting, diarrhea, constipation, dizziness, abdominal pain, skin rash, fevers, chills, night sweats, weight loss, swollen lymph nodes, body aches, joint swelling, muscle aches, chest pain, shortness of breath, mood changes.    Objective  Blood pressure  130/82, pulse 78, height 5\' 4"  (1.626 m), weight 121 lb (54.9 kg), SpO2 98 %.   Systems examined below as of 05/25/17 General: NAD A&O x3 mood, affect normal  HEENT: Pupils equal, extraocular movements intact no nystagmus Respiratory: not short of breath at rest or with speaking Cardiovascular: No lower extremity edema, non tender Skin: Warm dry intact with no signs of infection or rash on extremities or on axial skeleton. Abdomen: Soft nontender, no masses Neuro: Cranial nerves  intact, neurovascularly intact in all extremities with 2+ DTRs and 2+ pulses. Lymph: No lymphadenopathy appreciated today  Gait normal with good  balance and coordination.  MSK: Non tender with full range of motion and good stability and symmetric strength and tone of shoulders, elbows, wrist,  knee hips and ankles bilaterally.    Neck: Inspection continue mild increase in lordosis. No palpable stepoffs. Negative Spurling's maneuver. Lacks last 10 of extension and 5 of side bending bilaterally. Grip strength and sensation normal in bilateral hands Strength good C4 to T1 distribution No sensory change to C4 to T1 Negative Hoffman sign bilaterally Reflexes normal  Osteopathic findings C2 flexed rotated and side bent right C7 flexed rotated and side bent left T4 extended rotated and side bent right inhaled rib T6 extended rotated and side bent left T9 extended rotated and side bent left L3 flexed rotated and side bent right Sacrum right on right Pelvic shear noted    Impression and Recommendations:     This case required medical decision making of moderate complexity.      Note: This dictation was prepared with Dragon dictation along with smaller phrase technology. Any transcriptional errors that result from this process are unintentional.

## 2017-05-25 ENCOUNTER — Encounter: Payer: Self-pay | Admitting: Family Medicine

## 2017-05-25 ENCOUNTER — Ambulatory Visit (INDEPENDENT_AMBULATORY_CARE_PROVIDER_SITE_OTHER): Payer: BLUE CROSS/BLUE SHIELD | Admitting: Family Medicine

## 2017-05-25 VITALS — BP 130/82 | HR 78 | Ht 64.0 in | Wt 121.0 lb

## 2017-05-25 DIAGNOSIS — M999 Biomechanical lesion, unspecified: Secondary | ICD-10-CM

## 2017-05-25 DIAGNOSIS — M542 Cervicalgia: Secondary | ICD-10-CM

## 2017-05-25 NOTE — Assessment & Plan Note (Signed)
Decision today to treat with OMT was based on Physical Exam  After verbal consent patient was treated with HVLA, ME, FPR techniques in cervical, thoracic, lumbar and sacral and pelvis areas  Patient tolerated the procedure well with improvement in symptoms  Patient given exercises, stretches and lifestyle modifications  See medications in patient instructions if given  Patient will follow up in 4 weeks 

## 2017-05-25 NOTE — Patient Instructions (Signed)
Good to see you  Erica Chung is your friend.  Keep working on the posture On wall with heels, butt shoulder and head touching for a goal of 5 minutes daily  Keep up with the vitamins OK to work out come back and keep hands within peripheral vision.  See me again in 8 weeks Dam[facility]ol[facility].

## 2017-05-25 NOTE — Assessment & Plan Note (Signed)
Discussed with patient on the cut to start increasing activity to try to increase some muscle strength. We discussed the patient has some mild hypermobility syndrome that can be contribute in. We discussed posture and ergonomics, we discussed which objective is to do a which ones to avoid. Patient come back and see me again in 2 months

## 2017-06-03 ENCOUNTER — Encounter: Payer: Self-pay | Admitting: *Deleted

## 2017-06-03 ENCOUNTER — Encounter: Payer: Self-pay | Admitting: Family Medicine

## 2017-06-03 ENCOUNTER — Telehealth: Payer: Self-pay | Admitting: Family Medicine

## 2017-06-03 NOTE — Telephone Encounter (Signed)
Please advise pt I would recommend she start with a gynecologist given she feels she also feels perimenopausal and her thyroid studies are normal. Imbalance in Reproductive hormones can also cause some of her symptoms.   Usually women do not need a referral for gynecology, they can self refer. If she does we can certainly lace one after she finds one she would like to go to.

## 2017-06-03 NOTE — Telephone Encounter (Signed)
Sent reply to patient MY Chart message.

## 2017-07-23 ENCOUNTER — Ambulatory Visit: Payer: BLUE CROSS/BLUE SHIELD | Admitting: Family Medicine

## 2017-10-12 ENCOUNTER — Other Ambulatory Visit: Payer: Self-pay | Admitting: Orthopedic Surgery

## 2017-10-12 DIAGNOSIS — S63501D Unspecified sprain of right wrist, subsequent encounter: Secondary | ICD-10-CM

## 2017-12-17 ENCOUNTER — Other Ambulatory Visit: Payer: BLUE CROSS/BLUE SHIELD

## 2018-01-06 ENCOUNTER — Other Ambulatory Visit: Payer: BLUE CROSS/BLUE SHIELD

## 2018-01-06 ENCOUNTER — Inpatient Hospital Stay
Admission: RE | Admit: 2018-01-06 | Discharge: 2018-01-06 | Disposition: A | Discharge status: Home / Self Care | Payer: BLUE CROSS/BLUE SHIELD | Source: Ambulatory Visit | Attending: Orthopedic Surgery | Admitting: Orthopedic Surgery

## 2018-01-18 ENCOUNTER — Telehealth: Payer: Self-pay | Admitting: *Deleted

## 2018-01-18 NOTE — Telephone Encounter (Signed)
Ok with me. Please let her know my part-time schedule to ensure she is ok with that. Schedule 30 minute appointment. Thanks.

## 2018-01-18 NOTE — Telephone Encounter (Signed)
Dr. Claiborne Billings, okay to transfer?

## 2018-01-18 NOTE — Telephone Encounter (Signed)
Ok with me 

## 2018-01-18 NOTE — Telephone Encounter (Signed)
See note from Dr. Selena Batten and please schedule patient.

## 2018-01-18 NOTE — Telephone Encounter (Signed)
Copied from CRM [id number]. Topic: Appointment Scheduling - Scheduling Inquiry for Clinic >> Jan 18, 2018 12:06 PM Eston Mould B wrote: Reason for CRM:   PT is wanting to est care with Dr Selena Batten

## 2018-01-18 NOTE — Telephone Encounter (Signed)
Dr. Selena Batten, can you accept this patient if Dr. Claiborne Billings approves?

## 2018-01-20 ENCOUNTER — Ambulatory Visit
Admission: RE | Admit: 2018-01-20 | Discharge: 2018-01-20 | Disposition: A | Discharge status: Home / Self Care | Payer: PRIVATE HEALTH INSURANCE | Source: Ambulatory Visit | Attending: Orthopedic Surgery | Admitting: Orthopedic Surgery

## 2018-01-20 DIAGNOSIS — S63501D Unspecified sprain of right wrist, subsequent encounter: Secondary | ICD-10-CM

## 2018-01-20 MED ORDER — IOPAMIDOL (ISOVUE-M 200) INJECTION 41%
2.0000 mL | Freq: Once | INTRAMUSCULAR | Status: AC
  Administered 2018-01-20: 2 mL via INTRA_ARTICULAR

## 2018-03-11 ENCOUNTER — Encounter: Payer: BLUE CROSS/BLUE SHIELD | Admitting: Family Medicine

## 2018-03-11 DIAGNOSIS — Z0289 Encounter for other administrative examinations: Secondary | ICD-10-CM

## 2018-05-06 ENCOUNTER — Encounter: Payer: Self-pay | Admitting: Family Medicine

## 2018-05-09 ENCOUNTER — Encounter: Payer: Self-pay | Admitting: Family Medicine

## 2018-05-17 ENCOUNTER — Encounter: Payer: Self-pay | Admitting: Family Medicine

## 2018-05-17 ENCOUNTER — Ambulatory Visit (INDEPENDENT_AMBULATORY_CARE_PROVIDER_SITE_OTHER): Payer: BLUE CROSS/BLUE SHIELD | Admitting: Family Medicine

## 2018-05-17 VITALS — BP 124/74 | HR 79 | Temp 99.0°F | Resp 18 | Ht 65.0 in | Wt 121.0 lb

## 2018-05-17 DIAGNOSIS — Z0000 Encounter for general adult medical examination without abnormal findings: Secondary | ICD-10-CM

## 2018-05-17 DIAGNOSIS — Z1231 Encounter for screening mammogram for malignant neoplasm of breast: Secondary | ICD-10-CM

## 2018-05-17 DIAGNOSIS — E559 Vitamin D deficiency, unspecified: Secondary | ICD-10-CM

## 2018-05-17 DIAGNOSIS — R7989 Other specified abnormal findings of blood chemistry: Secondary | ICD-10-CM

## 2018-05-17 DIAGNOSIS — Z130 Encounter for screening for diseases of the blood and blood-forming organs and certain disorders involving the immune mechanism: Secondary | ICD-10-CM

## 2018-05-17 DIAGNOSIS — Z13220 Encounter for screening for lipoid disorders: Secondary | ICD-10-CM

## 2018-05-17 DIAGNOSIS — Z131 Encounter for screening for diabetes mellitus: Secondary | ICD-10-CM

## 2018-05-17 DIAGNOSIS — Z1239 Encounter for other screening for malignant neoplasm of breast: Secondary | ICD-10-CM

## 2018-05-17 LAB — CBC WITH DIFFERENTIAL/PLATELET
BASOS ABS: 0 10*3/uL (ref 0.0–0.1)
Basophils Relative: 0.8 % (ref 0.0–3.0)
EOS ABS: 0 10*3/uL (ref 0.0–0.7)
Eosinophils Relative: 0.8 % (ref 0.0–5.0)
HCT: 41.3 % (ref 36.0–46.0)
Hemoglobin: 13.8 g/dL (ref 12.0–15.0)
LYMPHS ABS: 1 10*3/uL (ref 0.7–4.0)
LYMPHS PCT: 28.4 % (ref 12.0–46.0)
MCHC: 33.5 g/dL (ref 30.0–36.0)
MCV: 89.9 fl (ref 78.0–100.0)
MONOS PCT: 11.3 % (ref 3.0–12.0)
Monocytes Absolute: 0.4 10*3/uL (ref 0.1–1.0)
NEUTROS PCT: 58.7 % (ref 43.0–77.0)
Neutro Abs: 2.1 10*3/uL (ref 1.4–7.7)
Platelets: 221 10*3/uL (ref 150.0–400.0)
RBC: 4.59 Mil/uL (ref 3.87–5.11)
RDW: 13.2 % (ref 11.5–15.5)
WBC: 3.5 10*3/uL — ABNORMAL LOW (ref 4.0–10.5)

## 2018-05-17 LAB — COMPREHENSIVE METABOLIC PANEL
ALK PHOS: 43 U/L (ref 39–117)
ALT: 13 U/L (ref 0–35)
AST: 15 U/L (ref 0–37)
Albumin: 4.5 g/dL (ref 3.5–5.2)
BUN: 13 mg/dL (ref 6–23)
CO2: 27 meq/L (ref 19–32)
Calcium: 9.7 mg/dL (ref 8.4–10.5)
Chloride: 104 mEq/L (ref 96–112)
Creatinine, Ser: 0.81 mg/dL (ref 0.40–1.20)
GFR: 80.65 mL/min (ref >60.00)
GLUCOSE: 96 mg/dL (ref 70–99)
Potassium: 4.3 mEq/L (ref 3.5–5.1)
Sodium: 138 mEq/L (ref 135–145)
TOTAL PROTEIN: 7.2 g/dL (ref 6.0–8.3)
Total Bilirubin: 0.7 mg/dL (ref 0.2–1.2)

## 2018-05-17 LAB — LIPID PANEL
CHOL/HDL RATIO: 3
Cholesterol: 179 mg/dL (ref 0–200)
HDL: 67.1 mg/dL (ref >39.00)
LDL Cholesterol: 99 mg/dL (ref 0–99)
NONHDL: 111.88
Triglycerides: 63 mg/dL (ref 0.0–149.0)
VLDL: 12.6 mg/dL (ref 0.0–40.0)

## 2018-05-17 LAB — TSH: TSH: 1.71 u[IU]/mL (ref 0.35–4.50)

## 2018-05-17 LAB — T4, FREE: Free T4: 0.83 ng/dL (ref 0.60–1.60)

## 2018-05-17 LAB — HEMOGLOBIN A1C: HEMOGLOBIN A1C: 5.5 % (ref 4.6–6.5)

## 2018-05-17 LAB — T3, FREE: T3 FREE: 3.5 pg/mL (ref 2.3–4.2)

## 2018-05-17 LAB — VITAMIN D 25 HYDROXY (VIT D DEFICIENCY, FRACTURES): VITD: 22.05 ng/mL — ABNORMAL LOW (ref 30.00–100.00)

## 2018-05-17 NOTE — Progress Notes (Signed)
Patient ID: Erica Chung, female  DOB: 02-26-1971, 47 y.o.   MRN: [id number] Patient Care Team    Relationship Specialty Notifications Start End  Erica Hillock, DO PCP - General Family Medicine  01/18/17   Erica Chung., MD Referring Physician Cardiology  01/18/17    Comment: PFO  Begovich, Erica Girt, DO  Sports Medicine  01/18/17   Erica Pila, MD Referring Physician Obstetrics and Gynecology  02/26/17     Chief Complaint  Patient presents with  . Annual Exam    Subjective:  Erica Chung is a 47 y.o.  Female  present for CPE. All past medical history, surgical history, allergies, family history, immunizations, medications and social history were updated in the electronic medical record today. All recent labs, ED visits and hospitalizations within the last year were reviewed. She may be moving to [place] this year.   Health maintenance: updated 05/17/18 Colonoscopy:No family history, routine screening at 49. Mammogram: No family history, has gynecology. 12/2015, birads1 per records.  Cervical cancer screening: last pap: Reported 06/21/2016. Establishing with new gyn.  Immunizations: tdap due 2020, Influenza declined(encouraged yearly) Infectious disease screening: HIV completed DEXA: n/a Assistive device: none Oxygen OEU:MPNT Patient has a Dental home. Hospitalizations/ED visits: reviewed  Depression screen [facility] 2/9 05/17/2018 02/26/2017 01/18/2017  Decreased Interest 0 0 0  Down, Depressed, Hopeless 0 0 0  PHQ - 2 Score 0 0 0   No flowsheet data found.   Current Exercise Habits: The patient has a physically strenous job, but has no regular exercise apart from work. Exercise limited by: None identified   Immunization History  Administered Date(s) Administered  . Varicella 06/20/2014    Past Medical History:  Diagnosis Date  . Allergic rhinitis   . Allergy   . Anxiety   . 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  Cervicalgia   . Chronic headaches   . Depression   . Deviated septum    perforation  . Dysmenorrhea   . Frequent headaches   . Goiter diffuse   . Hematuria   . Mitral valve prolapse   . Muscle weakness (generalized)   . Palpitation    pvc  . Pap smear abnormality of cervix/human papillomavirus (HPV) positive 2017  . Patent foramen ovale   . Reactive airway disease   . Vitamin D deficiency    Allergies  Allergen Reactions  . Tree Extract     Tree, grass, shrubs   Past Surgical History:  Procedure Laterality Date  . DENTAL SURGERY  1990   wisdom teeth   Family History  Problem Relation Age of Onset  . Depression Mother   . Alcohol abuse Father   . Hearing loss Father   . Early death Father    Social History   Socioeconomic History  . Marital status: Single    Spouse name: Not on file  . Number of children: Not on file  . Years of education: 67  . Highest education level: Not on file  Occupational History  . Occupation: Risk manager  Social Needs  . Financial resource strain: Not on file  . Food insecurity:    Worry: Not on file    Inability: Not on file  . Transportation needs:    Medical: Not on file    Non-medical: Not on file  Tobacco Use  . Smoking status: Never Smoker  . Smokeless tobacco: Never Used  Substance and Sexual Activity  . Alcohol use: Yes  Comment: occasional  . Drug use: No  . Sexual activity: Yes    Birth control/protection: Condom  Lifestyle  . Physical activity:    Days per week: Not on file    Minutes per session: Not on file  . Stress: Not on file  Relationships  . Social connections:    Talks on phone: Not on file    Gets together: Not on file    Attends religious service: Not on file    Active member of club or organization: Not on file    Attends meetings of clubs or organizations: Not on file    Relationship status: Not on file  . Intimate partner violence:    Fear of current or ex partner: Not on file     Emotionally abused: Not on file    Physically abused: Not on file    Forced sexual activity: Not on file  Other Topics Concern  . Not on file  Social History Narrative   Single. 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    Associates degree.    Occasionally uses herbal remedies and takes daily vitamins.   Wears her seatbelt. Smoke detector in the home.   Feels safe in her relationships.   Allergies as of 05/17/2018      Reactions   Tree Extract    Tree, grass, shrubs      Medication List        Accurate as of 05/17/18 10:07 AM. Always use your most recent med list.          multivitamin capsule Take by mouth.       All past medical history, surgical history, allergies, family history, immunizations andmedications were updated in the EMR today and reviewed under the history and medication portions of their EMR.     No results found for this or any previous visit (from the past 2160 hour(s)).   ROS: 14 pt review of systems performed and negative (unless mentioned in an HPI)  Objective: BP 124/74 (BP Location: Right Arm, Patient Position: Sitting, Cuff Size: Normal)   Pulse 79   Temp 99 F (37.2 C)   Resp 18   Ht '5\' 5"'$  (1.651 m)   Wt 121 lb (54.9 kg)   LMP 05/03/2018   SpO2 98%   BMI 20.14 kg/m  Gen: Afebrile. No acute distress. Nontoxic in appearance, well-developed, well-nourished,  Pleasant caucasian female.  HENT: AT. [place]. Bilateral TM visualized and normal in appearance, normal external auditory canal. MMM, no oral lesions, adequate dentition. Bilateral nares within normal limits. Throat without erythema, ulcerations or exudates. no Cough on exam, no hoarseness on exam. Eyes:Pupils Equal Round Reactive to light, Extraocular movements intact,  Conjunctiva without redness, discharge or icterus. Neck/lymp/endocrine: Supple,no lymphadenopathy, no thyromegaly CV: RRR no murmur, no edema, +2/4 P posterior tibialis pulses. no carotid bruits. No JVD. Chest: CTAB, no wheeze,  rhonchi or crackles. normal Respiratory effort. good Air movement. Abd: Soft. flat. NTND. BS present. no Masses palpated. No hepatosplenomegaly. No rebound tenderness or guarding. Skin: no rashes, purpura or petechiae. Warm and well-perfused. Skin intact. Neuro/Msk:  Normal gait. PERLA. EOMi. Alert. Oriented x3.  Cranial nerves II through XII intact. Muscle strength 5/5 upper/lower extremity. DTRs equal bilaterally. Psych: Normal affect, dress and demeanor. Normal speech. Normal thought content and judgment.  No exam data present  Assessment/plan: Madeeha Costantino is a 47 y.o. female present for CPE. Screening for deficiency anemia - CBC w/Diff Diabetes mellitus screening - HgB A1c Abnormal thyroid stimulating hormone level -  TSH - T4, free - T3, free Vitamin D deficiency - Vitamin D (25 hydroxy) Lipid screening - Lipid panel Breast cancer screening - MM DIGITAL SCREENING BILATERAL; Future Encounter for preventive health examination - Comp Met (CMET)--> pt desires Patient was encouraged to exercise greater than 150 minutes a week. Patient was encouraged to choose a diet filled with fresh fruits and vegetables, and lean meats. AVS provided to patient today for education/recommendation on gender specific health and safety maintenance. Colonoscopy:No family history, routine screening at 31. Mammogram: No family history, has gynecology. 12/2015, birads1 per records.  Cervical cancer screening: last pap: Reported 06/21/2016. Establishing with new gyn.  Immunizations: tdap due 2020, Influenza declined(encouraged yearly) Infectious disease screening: HIV completed  Return in about 1 year (around 05/18/2019) for CPE.  Electronically signed by: Howard Pouch, DO [place]

## 2018-05-17 NOTE — Patient Instructions (Signed)
It was great to see you today. We will call you with all your lab results once available.    Health Maintenance, Female Adopting a healthy lifestyle and getting preventive care can go a long way to promote health and wellness. Talk with your health care provider about what schedule of regular examinations is right for you. This is a good chance for you to check in with your provider about disease prevention and staying healthy. In between checkups, there are plenty of things you can do on your own. Experts have done a lot of research about which lifestyle changes and preventive measures are most likely to keep you healthy. Ask your health care provider for more information. Weight and diet Eat a healthy diet  Be sure to include plenty of vegetables, fruits, low-fat dairy products, and lean protein.  Do not eat a lot of foods high in solid fats, added sugars, or salt.  Get regular exercise. This is one of the most important things you can do for your health. ? Most adults should exercise for at least 150 minutes each week. The exercise should increase your heart rate and make you sweat (moderate-intensity exercise). ? Most adults should also do strengthening exercises at least twice a week. This is in addition to the moderate-intensity exercise.  Maintain a healthy weight  Body mass index (BMI) is a measurement that can be used to identify possible weight problems. It estimates body fat based on height and weight. Your health care provider can help determine your BMI and help you achieve or maintain a healthy weight.  For females 105 years of age and older: ? A BMI below 18.5 is considered underweight. ? A BMI of 18.5 to 24.9 is normal. ? A BMI of 25 to 29.9 is considered overweight. ? A BMI of 30 and above is considered obese.  Watch levels of cholesterol and blood lipids  You should start having your blood tested for lipids and cholesterol at 47 years of age, then have this test every 5  years.  You may need to have your cholesterol levels checked more often if: ? Your lipid or cholesterol levels are high. ? You are older than 47 years of age. ? You are at high risk for heart disease.  Cancer screening Lung Cancer  Lung cancer screening is recommended for adults 24-47 years old who are at high risk for lung cancer because of a history of smoking.  A yearly low-dose CT scan of the lungs is recommended for people who: ? Currently smoke. ? Have quit within the past 15 years. ? Have at least a 30-pack-year history of smoking. A pack year is smoking an average of one pack of cigarettes a day for 1 year.  Yearly screening should continue until it has been 15 years since you quit.  Yearly screening should stop if you develop a health problem that would prevent you from having lung cancer treatment.  Breast Cancer  Practice breast self-awareness. This means understanding how your breasts normally appear and feel.  It also means doing regular breast self-exams. Let your health care provider know about any changes, no matter how small.  If you are in your 20s or 30s, you should have a clinical breast exam (CBE) by a health care provider every 1-3 years as part of a regular health exam.  If you are 38 or older, have a CBE every year. Also consider having a breast X-ray (mammogram) every year.  If you have a  family history of breast cancer, talk to your health care provider about genetic screening.  If you are at high risk for breast cancer, talk to your health care provider about having an MRI and a mammogram every year.  Breast cancer gene (BRCA) assessment is recommended for women who have family members with BRCA-related cancers. BRCA-related cancers include: ? Breast. ? Ovarian. ? Tubal. ? Peritoneal cancers.  Results of the assessment will determine the need for genetic counseling and BRCA1 and BRCA2 testing.  Cervical Cancer Your health care provider may  recommend that you be screened regularly for cancer of the pelvic organs (ovaries, uterus, and vagina). This screening involves a pelvic examination, including checking for microscopic changes to the surface of your cervix (Pap test). You may be encouraged to have this screening done every 3 years, beginning at age 47.  For women ages 52-65, health care providers may recommend pelvic exams and Pap testing every 3 years, or they may recommend the Pap and pelvic exam, combined with testing for human papilloma virus (HPV), every 5 years. Some types of HPV increase your risk of cervical cancer. Testing for HPV may also be done on women of any age with unclear Pap test results.  Other health care providers may not recommend any screening for nonpregnant women who are considered low risk for pelvic cancer and who do not have symptoms. Ask your health care provider if a screening pelvic exam is right for you.  If you have had past treatment for cervical cancer or a condition that could lead to cancer, you need Pap tests and screening for cancer for at least 20 years after your treatment. If Pap tests have been discontinued, your risk factors (such as having a new sexual partner) need to be reassessed to determine if screening should resume. Some women have medical problems that increase the chance of getting cervical cancer. In these cases, your health care provider may recommend more frequent screening and Pap tests.  Colorectal Cancer  This type of cancer can be detected and often prevented.  Routine colorectal cancer screening usually begins at 47 years of age and continues through 47 years of age.  Your health care provider may recommend screening at an earlier age if you have risk factors for colon cancer.  Your health care provider may also recommend using home test kits to check for hidden blood in the stool.  A small camera at the end of a tube can be used to examine your colon directly  (sigmoidoscopy or colonoscopy). This is done to check for the earliest forms of colorectal cancer.  Routine screening usually begins at age 66.  Direct examination of the colon should be repeated every 5-10 years through 47 years of age. However, you may need to be screened more often if early forms of precancerous polyps or small growths are found.  Skin Cancer  Check your skin from head to toe regularly.  Tell your health care provider about any new moles or changes in moles, especially if there is a change in a mole's shape or color.  Also tell your health care provider if you have a mole that is larger than the size of a pencil eraser.  Always use sunscreen. Apply sunscreen liberally and repeatedly throughout the day.  Protect yourself by wearing long sleeves, pants, a wide-brimmed hat, and sunglasses whenever you are outside.  Heart disease, diabetes, and high blood pressure  High blood pressure causes heart disease and increases the risk of  stroke. High blood pressure is more likely to develop in: ? People who have blood pressure in the high end of the normal range (130-139/85-89 mm Hg). ? People who are overweight or obese. ? People who are African American.  If you are 32-32 years of age, have your blood pressure checked every 3-5 years. If you are 79 years of age or older, have your blood pressure checked every year. You should have your blood pressure measured twice-once when you are at a hospital or clinic, and once when you are not at a hospital or clinic. Record the average of the two measurements. To check your blood pressure when you are not at a hospital or clinic, you can use: ? An automated blood pressure machine at a pharmacy. ? A home blood pressure monitor.  If you are between 33 years and 15 years old, ask your health care provider if you should take aspirin to prevent strokes.  Have regular diabetes screenings. This involves taking a blood sample to check your  fasting blood sugar level. ? If you are at a normal weight and have a low risk for diabetes, have this test once every three years after 47 years of age. ? If you are overweight and have a high risk for diabetes, consider being tested at a younger age or more often. Preventing infection Hepatitis B  If you have a higher risk for hepatitis B, you should be screened for this virus. You are considered at high risk for hepatitis B if: ? You were born in a country where hepatitis B is common. Ask your health care provider which countries are considered high risk. ? Your parents were born in a high-risk country, and you have not been immunized against hepatitis B (hepatitis B vaccine). ? You have HIV or AIDS. ? You use needles to inject street drugs. ? You live with someone who has hepatitis B. ? You have had sex with someone who has hepatitis B. ? You get hemodialysis treatment. ? You take certain medicines for conditions, including cancer, organ transplantation, and autoimmune conditions.  Hepatitis C  Blood testing is recommended for: ? Everyone born from [birth year] through 1965. ? Anyone with known risk factors for hepatitis C.  Sexually transmitted infections (STIs)  You should be screened for sexually transmitted infections (STIs) including gonorrhea and chlamydia if: ? You are sexually active and are younger than 47 years of age. ? You are older than 47 years of age and your health care provider tells you that you are at risk for this type of infection. ? Your sexual activity has changed since you were last screened and you are at an increased risk for chlamydia or gonorrhea. Ask your health care provider if you are at risk.  If you do not have HIV, but are at risk, it may be recommended that you take a prescription medicine daily to prevent HIV infection. This is called pre-exposure prophylaxis (PrEP). You are considered at risk if: ? You are sexually active and do not regularly use condoms  or know the HIV status of your partner(s). ? You take drugs by injection. ? You are sexually active with a partner who has HIV.  Talk with your health care provider about whether you are at high risk of being infected with HIV. If you choose to begin PrEP, you should first be tested for HIV. You should then be tested every 3 months for as long as you are taking PrEP. Pregnancy  If  you are premenopausal and you may become pregnant, ask your health care provider about preconception counseling.  If you may become pregnant, take 400 to 800 micrograms (mcg) of folic acid every day.  If you want to prevent pregnancy, talk to your health care provider about birth control (contraception). Osteoporosis and menopause  Osteoporosis is a disease in which the bones lose minerals and strength with aging. This can result in serious bone fractures. Your risk for osteoporosis can be identified using a bone density scan.  If you are 46 years of age or older, or if you are at risk for osteoporosis and fractures, ask your health care provider if you should be screened.  Ask your health care provider whether you should take a calcium or vitamin D supplement to lower your risk for osteoporosis.  Menopause may have certain physical symptoms and risks.  Hormone replacement therapy may reduce some of these symptoms and risks. Talk to your health care provider about whether hormone replacement therapy is right for you. Follow these instructions at home:  Schedule regular health, dental, and eye exams.  Stay current with your immunizations.  Do not use any tobacco products including cigarettes, chewing tobacco, or electronic cigarettes.  If you are pregnant, do not drink alcohol.  If you are breastfeeding, limit how much and how often you drink alcohol.  Limit alcohol intake to no more than 1 drink per day for nonpregnant women. One drink equals 12 ounces of beer, 5 ounces of wine, or 1 ounces of hard  liquor.  Do not use street drugs.  Do not share needles.  Ask your health care provider for help if you need support or information about quitting drugs.  Tell your health care provider if you often feel depressed.  Tell your health care provider if you have ever been abused or do not feel safe at home. This information is not intended to replace advice given to you by your health care provider. Make sure you discuss any questions you have with your health care provider. Document Released: 03/23/2011 Document Revised: 02/13/2016 Document Reviewed: 06/11/2015 Elsevier Interactive Patient Education  [employer].

## 2018-05-18 ENCOUNTER — Telehealth: Payer: Self-pay | Admitting: Family Medicine

## 2018-05-18 NOTE — Telephone Encounter (Signed)
Spoke with patient reviewed lab results and answered all questions.

## 2018-05-18 NOTE — Telephone Encounter (Signed)
Copied from CRM [phone]. Topic: Quick Communication - Lab Results >> May 18, 2018  8:53 AM Angela NevinWilliams, Candice N wrote: Pt called requesting to speak with an RN or Dr. Claiborne BillingsKuneff about her lab results. She has received them, she would just like to discuss them further if possible. Pt is requesting a call back at [phone]

## 2018-05-18 NOTE — Telephone Encounter (Signed)
Patient calling to check status of phone call. Please advise.

## 2018-06-08 ENCOUNTER — Telehealth: Payer: Self-pay | Admitting: Family Medicine

## 2018-06-08 NOTE — Telephone Encounter (Signed)
Advised [facility], [address], [place] Thurs 06/16/18 6:15PM. If she needs to reschedule call [phone]

## 2018-08-23 ENCOUNTER — Encounter: Payer: Self-pay | Admitting: Family Medicine

## 2018-08-23 LAB — HM MAMMOGRAPHY

## 2018-08-24 ENCOUNTER — Telehealth: Payer: Self-pay

## 2018-08-24 ENCOUNTER — Telehealth: Payer: Self-pay | Admitting: Family Medicine

## 2018-08-24 NOTE — Telephone Encounter (Signed)
Copied from CRM [phone]. Topic: General - Inquiry >> Aug 24, 2018  2:02 PM Angela NevinWilliams, Candice N wrote: Reason for CRM: Olegario MessierKathy with [facility] called stating that she had patient on the line inquiring on why she needed additional imaging done after initial imaging done yesterday. Patient was then transferred and inquired as to if there was something she should know about her imaging and why further imaging would be needed as suggested by [facility]--Attempted to contact office and advised to send CRM. Advised patient that she would be receiving a call back to discuss further. Please advise.

## 2018-08-24 NOTE — Telephone Encounter (Signed)
Copied from CRM [id number]. Topic: Quick Communication - See Telephone Encounter >> Aug 24, 2018  5:01 PM Floria RavelingStovall, Shana A wrote: CRM for notification. See Telephone encounter for: 08/24/18.  Pt called in and would like to ask Dr Claiborne BillingsKuneff about the Calcification in he breast.  She stated that she had xray last year on her stomach and she stated that the dr told her that she had a lot of Calcification in her stomachs.  She would like to know if this would be a concern or something that needs to be checked into?    Best number  [phone]

## 2018-08-24 NOTE — Telephone Encounter (Signed)
Contacted pt to address her concerns. It appears [facility] called her to schedule the additional studies, but she was confused on WHY she needed the studies. It was not explained to her that her right breast showed grouped calcifications that can sometimes be concerning for malignancy. It was explained her by this provider today. Orders received via fax --> completed and returned.   She is already scheduled for additional images Monday per her report.

## 2018-08-25 NOTE — Telephone Encounter (Signed)
Patient checking on the status of message below, please advise best # [phone]

## 2018-08-25 NOTE — Telephone Encounter (Signed)
I LM for patient to let her know that Dr. Claiborne BillingsKuneff is out of the office this afternoon, but that I will address this with her in the morning and let her know any advise from PCP.   If patient calls back, okay for [facility] to give this information to patient.

## 2018-08-26 NOTE — Telephone Encounter (Signed)
Discussed this with Dr. Claiborne BillingsKuneff, and she wanted me to let patient know that she does not feel like these are related.  She wants patient to go for testing on Monday and we will go from there.  I spoke with patient and she is aware and will go for testing Monday.

## 2018-08-29 ENCOUNTER — Telehealth: Payer: Self-pay | Admitting: Family Medicine

## 2018-08-29 DIAGNOSIS — R921 Mammographic calcification found on diagnostic imaging of breast: Secondary | ICD-10-CM

## 2018-08-29 NOTE — Telephone Encounter (Signed)
[facility] had [facility] today and she was found to have specious calcifications to the right breast.  Dr Adriana Simasook recommending biopsy. Need order faxed to [phone] ([facility])  Rt breast Stereo tactic biopsy For Specious calcifications

## 2018-08-29 NOTE — Telephone Encounter (Signed)
[facility] today and she was found to have specious calcifications to the right breast.  Dr Adriana Simasook recommending biopsy. Need order faxed to [phone] ([facility])  Rt breast Stereo tactic biopsy For Specious calcifications  [facility]

## 2018-08-30 ENCOUNTER — Encounter: Payer: Self-pay | Admitting: Family Medicine

## 2018-08-30 NOTE — Telephone Encounter (Signed)
Contacted patient to discuss her repeat [facility] results [facility] Desired order placed and will be faxed today.  Pt appreciated the call and questions were answered.   Electronically Signed by: Felix Pacinienee Kuneff, DO [facility] OR

## 2018-08-30 NOTE — Addendum Note (Signed)
Addended by: Felix PaciniKUNEFF, Jaden Batchelder A on: 08/30/2018 01:02 PM   Modules accepted: Orders

## 2018-09-05 NOTE — Telephone Encounter (Signed)
[facility] [phone]

## 2018-09-05 NOTE — Telephone Encounter (Signed)
Fax number incorrect-[facility] go through[facility] is [phone]. Refaxed signed order

## 2018-09-05 NOTE — Telephone Encounter (Signed)
Patient called and stated that [facility] had not called her to schedule her biopsy and they advised her that they have not received anything from Dr Claiborne BillingsKuneff. Patient states she would like to do this by the end of the year due to changing jobs and will not have insurance after December 31st. Please Advise.

## 2018-09-20 ENCOUNTER — Encounter: Payer: Self-pay | Admitting: Family Medicine

## 2018-09-20 HISTORY — PX: BREAST BIOPSY: SHX20

## 2018-09-22 ENCOUNTER — Encounter: Payer: Self-pay | Admitting: Family Medicine

## 2018-09-23 ENCOUNTER — Encounter: Payer: Self-pay | Admitting: Family Medicine

## 2018-09-26 ENCOUNTER — Telehealth: Payer: Self-pay | Admitting: Family Medicine

## 2018-09-26 NOTE — Telephone Encounter (Signed)
Copied from CRM [id number]. Topic: General - Other >> Sep 26, 2018  2:18 PM Herby Abraham C wrote: Reason for CRM: pt is calling in for her imaging results.   CB: [phone] >> Sep 26, 2018  2:32 PM Eulah Pont, CMA wrote: Jamal Maes to call patient back, no answer and mail box full.   Please ask patient WHAT imaging result she is inquiring about?    Also, I see she may have went to get mammogram done in December at [facility] and if that is the imaging she wants results for, we have not received those.  Please have her contact the imaging center she went for mammogram results.  >> Sep 26, 2018  2:38 PM Jay Schlichter wrote: Pt returned call. She says she had a breast biopsy and was told to contact pcp to get results.  Please call [phone], please leave message if no answer.

## 2018-09-26 NOTE — Telephone Encounter (Signed)
  Biopsy results final 09/26/2018- at 1126am. Pt called for results at 243pm.   Please inform patient the following information: Biopsy resulted with benign breast tissue with micro-calcifications. Negative for malignancy. Great news.

## 2018-09-27 NOTE — Telephone Encounter (Signed)
Patient aware of results, voiced understanding.

## 2019-05-19 ENCOUNTER — Encounter: Payer: BLUE CROSS/BLUE SHIELD | Admitting: Family Medicine

## 2019-07-28 IMAGING — MR MR WRIST*R* W/ CM
4 of 5 series · 23 of 40 positions shown · IV contrast (agent unspecified)
Comparison: None.

CLINICAL DATA: Right wrist pain after a fall.  Ongoing for 3 years.

EXAM:
MRI OF THE RIGHT WRIST WITH CONTRAST (MR Arthrogram)
TECHNIQUE: Multiplanar, multisequence MR imaging of the wrist was performed
immediately following contrast injection into the radiocarpal joint
under fluoroscopic guidance. No intravenous contrast was
administered.

[Series 5: T2 fat-sat · axial · 3.5mm · 0.20mm/px · z∈[-35,+45]mm · 7 of 20 slices shown (1 of 2)]
[im 1/20]
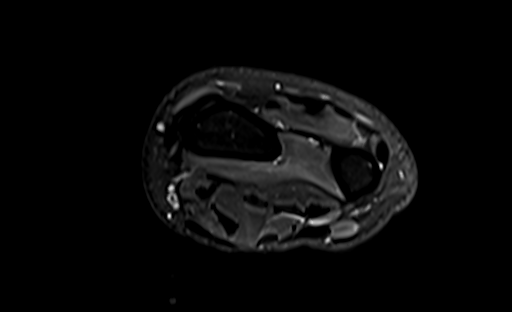
[im 4/20]
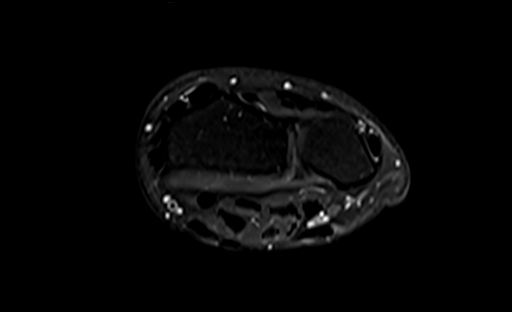
[im 7/20]
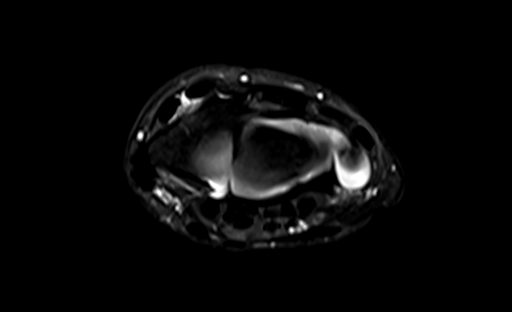
[im 10/20]
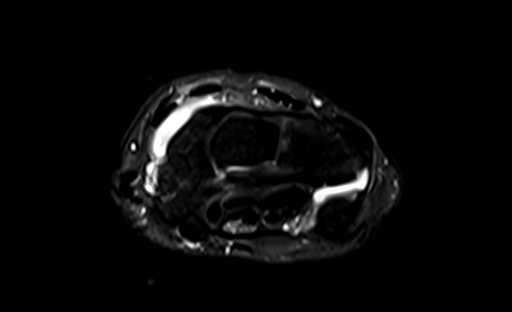
[im 13/20]
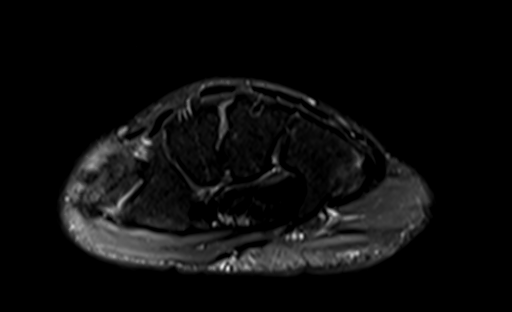
[im 16/20]
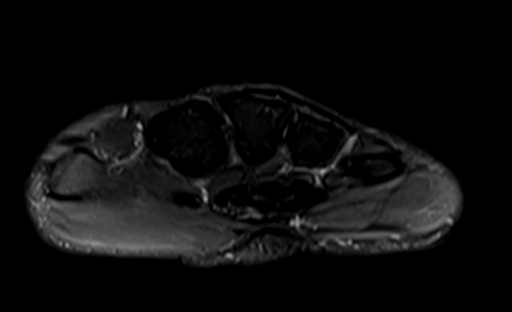
[im 20/20]
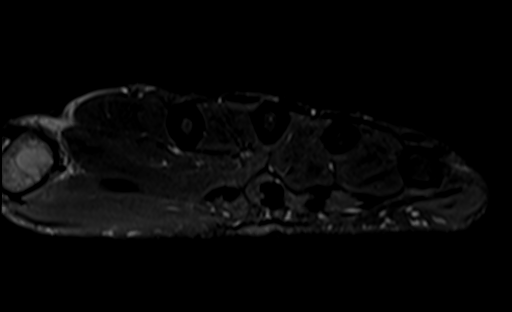

[Series 6: T1 fat-sat · coronal · 2.5mm · 0.20mm/px · 5 of 18 slices shown (1 of 2)]
[im 1/18]
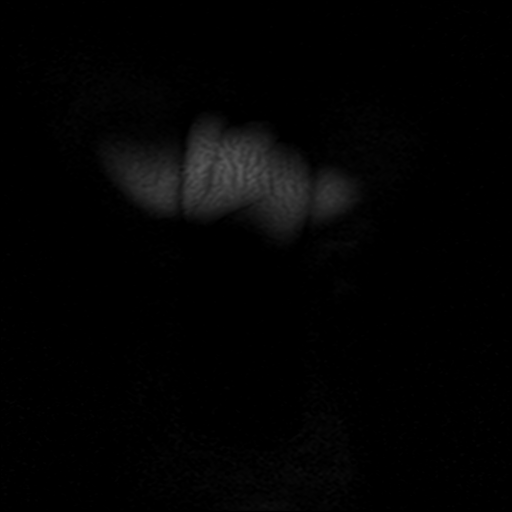
[im 3/18]
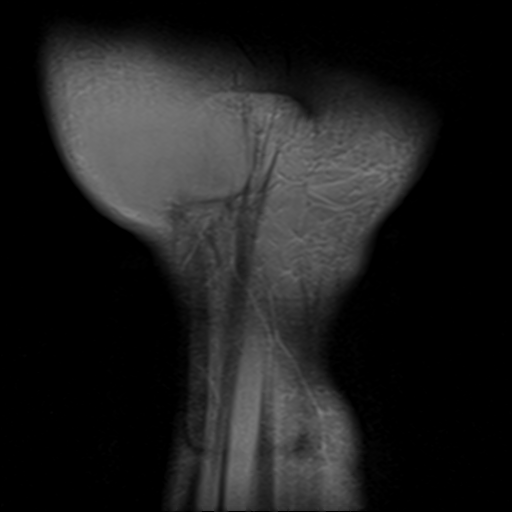
[im 5/18]
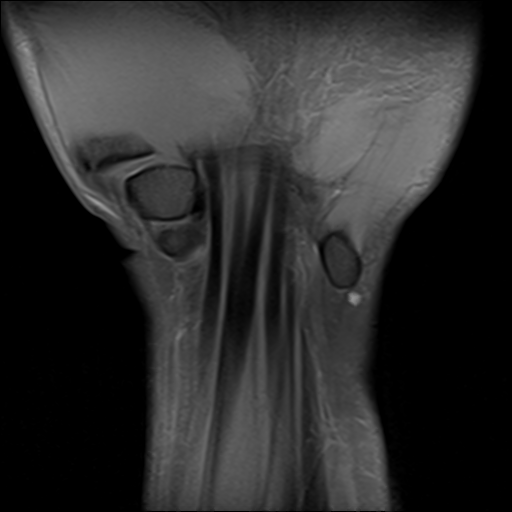
[im 10/18]
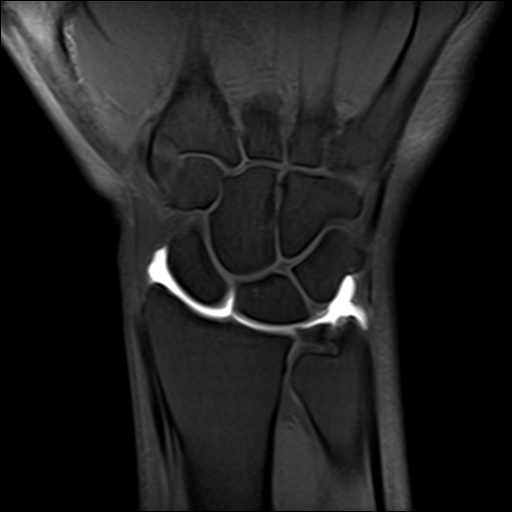
[im 15/18]
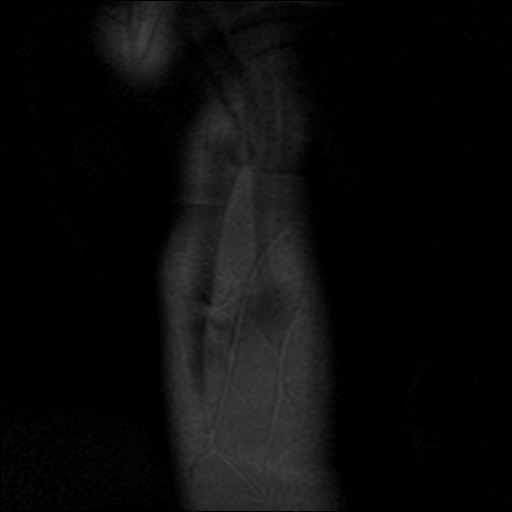

[Series 7: T1 fat-sat · sagittal · 3.0mm · 0.20mm/px · 3 of 21 slices shown (2 of 2)]
[im 3/21]
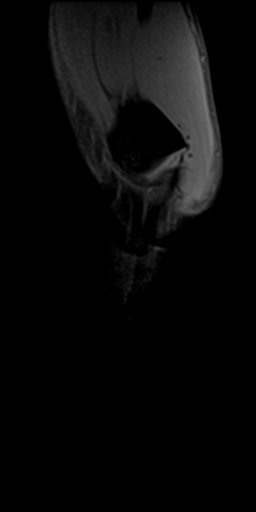
[im 11/21]
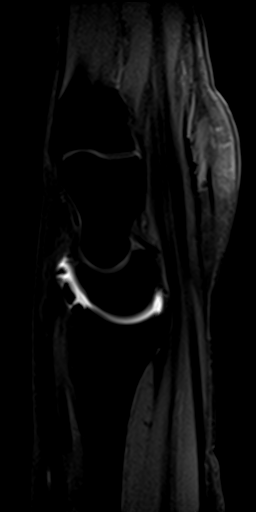
[im 18/21]
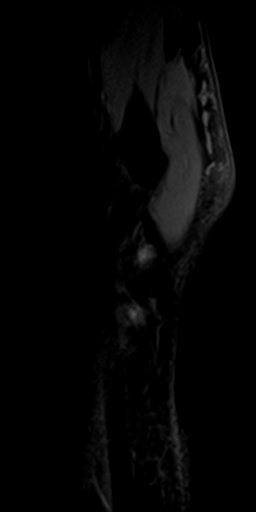

[Series 9: T2 fat-sat · coronal · 2.5mm · 0.20mm/px · 8 of 18 slices shown (2 of 2)]
[im 1/18]
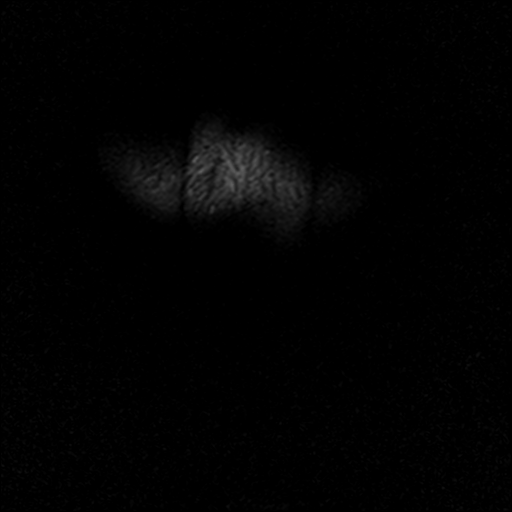
[im 3/18]
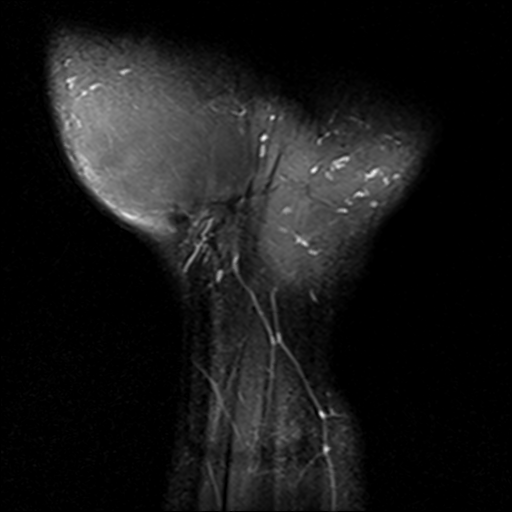
[im 5/18]
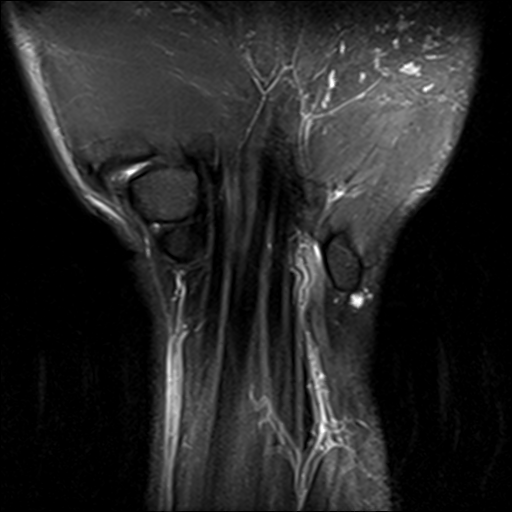
[im 8/18]
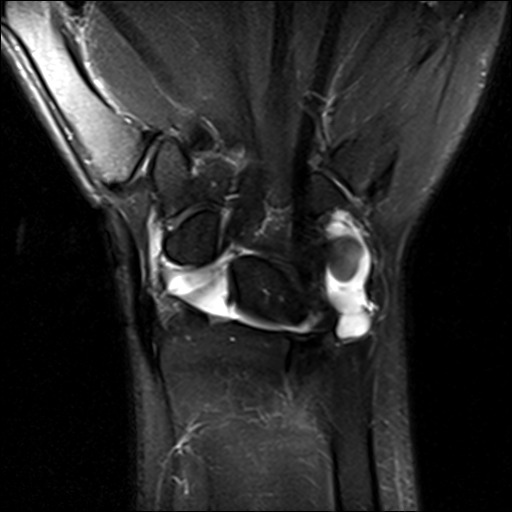
[im 10/18]
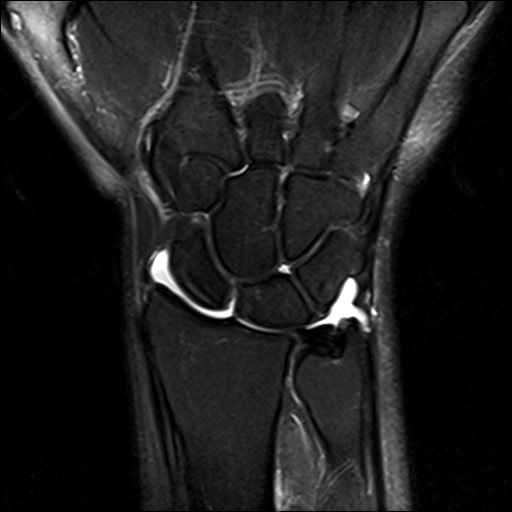
[im 13/18]
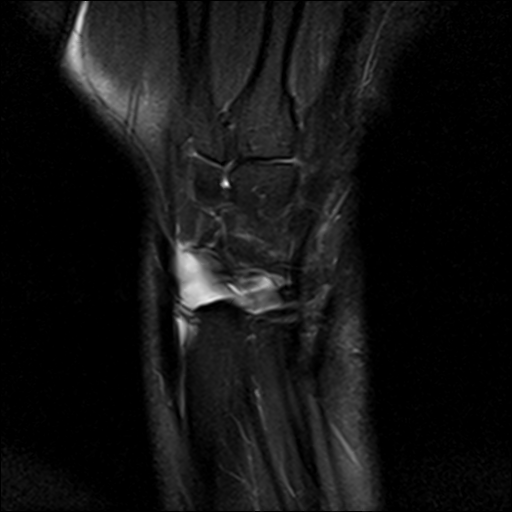
[im 15/18]
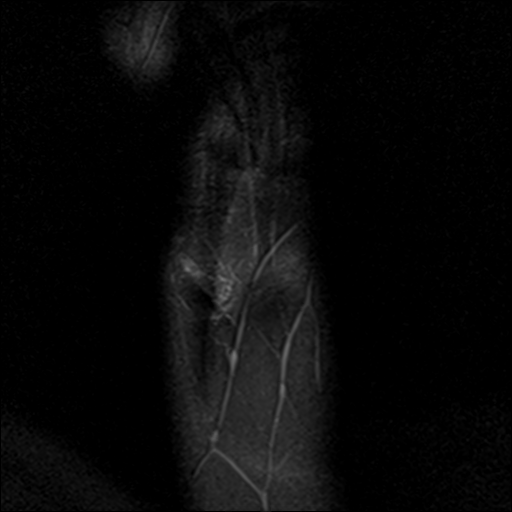
[im 18/18]
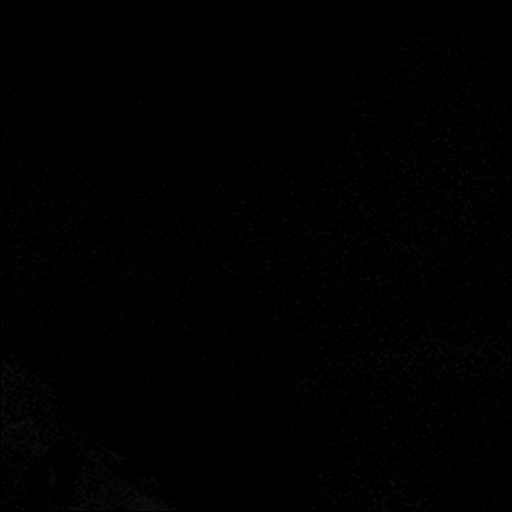

[23 of 40 positions shown; findings below may reference images not displayed]

FINDINGS: Ligaments: Intact lunotriquetral ligament. Tear of the volar band
and central portion of the scapholunate ligament. Dorsal band of the
scapholunate ligament is intact.

Triangular fibrocartilage: Intact TFCC.

Tendons: Intact flexor and extensor compartment tendons.

Carpal tunnel/median nerve: Normal carpal tunnel. Normal median
nerve.

Guyon's canal: Normal.

Joint/cartilage: Intraarticular contrast distending the radiocarpal
joint. No chondral defect.

Bones/carpal alignment: No acute osseous abnormality. No aggressive
osseous lesion. Normal alignment.

Other: No fluid collection or hematoma.
IMPRESSION: 1. Tear of the volar band and central portion of the scapholunate
ligament. Dorsal band of the scapholunate ligament is intact.
2.  No acute osseous injury of the wrist.

## 2023-08-23 ENCOUNTER — Ambulatory Visit: Payer: Self-pay | Admitting: Family Medicine
